# Patient Record
Sex: Male | Born: 1975 | Race: Black or African American | Hispanic: No | Marital: Single | Smoking: Current every day smoker
Health system: Southern US, Community
[De-identification: ages and names within clinical notes are randomized; demographics above are authoritative.]

## PROBLEM LIST (undated history)

## (undated) DIAGNOSIS — G43909 Migraine, unspecified, not intractable, without status migrainosus: Secondary | ICD-10-CM

## (undated) DIAGNOSIS — I2699 Other pulmonary embolism without acute cor pulmonale: Secondary | ICD-10-CM

## (undated) DIAGNOSIS — I10 Essential (primary) hypertension: Secondary | ICD-10-CM

## (undated) DIAGNOSIS — J189 Pneumonia, unspecified organism: Secondary | ICD-10-CM

---

## 1997-06-26 ENCOUNTER — Emergency Department (HOSPITAL_COMMUNITY): Admission: EM | Admit: 1997-06-26 | Discharge: 1997-06-26 | Payer: Self-pay | Admitting: Emergency Medicine

## 1998-05-30 ENCOUNTER — Emergency Department (HOSPITAL_COMMUNITY): Admission: EM | Admit: 1998-05-30 | Discharge: 1998-05-30 | Payer: Self-pay | Admitting: Emergency Medicine

## 1998-07-17 ENCOUNTER — Emergency Department (HOSPITAL_COMMUNITY): Admission: EM | Admit: 1998-07-17 | Discharge: 1998-07-17 | Payer: Self-pay | Admitting: Internal Medicine

## 1998-11-10 ENCOUNTER — Emergency Department (HOSPITAL_COMMUNITY): Admission: EM | Admit: 1998-11-10 | Discharge: 1998-11-10 | Payer: Self-pay | Admitting: Emergency Medicine

## 1998-11-11 ENCOUNTER — Encounter: Payer: Self-pay | Admitting: Emergency Medicine

## 1999-02-03 ENCOUNTER — Emergency Department (HOSPITAL_COMMUNITY): Admission: EM | Admit: 1999-02-03 | Discharge: 1999-02-03 | Payer: Self-pay | Admitting: Emergency Medicine

## 1999-08-28 ENCOUNTER — Emergency Department (HOSPITAL_COMMUNITY): Admission: EM | Admit: 1999-08-28 | Discharge: 1999-08-28 | Payer: Self-pay | Admitting: Emergency Medicine

## 2002-09-23 ENCOUNTER — Emergency Department (HOSPITAL_COMMUNITY): Admission: EM | Admit: 2002-09-23 | Discharge: 2002-09-23 | Payer: Self-pay | Admitting: Emergency Medicine

## 2003-01-06 ENCOUNTER — Emergency Department (HOSPITAL_COMMUNITY): Admission: EM | Admit: 2003-01-06 | Discharge: 2003-01-06 | Payer: Self-pay | Admitting: Emergency Medicine

## 2003-10-09 ENCOUNTER — Emergency Department (HOSPITAL_COMMUNITY): Admission: EM | Admit: 2003-10-09 | Discharge: 2003-10-09 | Payer: Self-pay | Admitting: Emergency Medicine

## 2003-10-09 ENCOUNTER — Emergency Department (HOSPITAL_COMMUNITY): Admission: EM | Admit: 2003-10-09 | Discharge: 2003-10-09 | Payer: Self-pay | Admitting: Family Medicine

## 2004-03-12 ENCOUNTER — Emergency Department (HOSPITAL_COMMUNITY): Admission: EM | Admit: 2004-03-12 | Discharge: 2004-03-12 | Payer: Self-pay | Admitting: Emergency Medicine

## 2004-03-24 ENCOUNTER — Emergency Department (HOSPITAL_COMMUNITY): Admission: EM | Admit: 2004-03-24 | Discharge: 2004-03-24 | Payer: Self-pay | Admitting: Emergency Medicine

## 2004-03-26 ENCOUNTER — Emergency Department (HOSPITAL_COMMUNITY): Admission: EM | Admit: 2004-03-26 | Discharge: 2004-03-26 | Payer: Self-pay | Admitting: Emergency Medicine

## 2004-12-29 ENCOUNTER — Emergency Department (HOSPITAL_COMMUNITY): Admission: EM | Admit: 2004-12-29 | Discharge: 2004-12-29 | Payer: Self-pay | Admitting: Emergency Medicine

## 2005-08-29 ENCOUNTER — Emergency Department (HOSPITAL_COMMUNITY): Admission: EM | Admit: 2005-08-29 | Discharge: 2005-08-30 | Payer: Self-pay | Admitting: *Deleted

## 2005-09-10 ENCOUNTER — Ambulatory Visit: Payer: Self-pay | Admitting: *Deleted

## 2005-09-10 ENCOUNTER — Ambulatory Visit: Payer: Self-pay | Admitting: Family Medicine

## 2005-09-24 ENCOUNTER — Ambulatory Visit: Payer: Self-pay | Admitting: Family Medicine

## 2005-10-01 ENCOUNTER — Ambulatory Visit: Payer: Self-pay | Admitting: Family Medicine

## 2005-10-07 ENCOUNTER — Ambulatory Visit: Payer: Self-pay | Admitting: Family Medicine

## 2005-10-21 ENCOUNTER — Emergency Department (HOSPITAL_COMMUNITY): Admission: EM | Admit: 2005-10-21 | Discharge: 2005-10-22 | Payer: Self-pay | Admitting: Emergency Medicine

## 2005-11-23 ENCOUNTER — Emergency Department (HOSPITAL_COMMUNITY): Admission: EM | Admit: 2005-11-23 | Discharge: 2005-11-23 | Payer: Self-pay | Admitting: Emergency Medicine

## 2006-03-13 ENCOUNTER — Emergency Department (HOSPITAL_COMMUNITY): Admission: EM | Admit: 2006-03-13 | Discharge: 2006-03-14 | Payer: Self-pay | Admitting: Emergency Medicine

## 2006-04-10 ENCOUNTER — Emergency Department (HOSPITAL_COMMUNITY): Admission: EM | Admit: 2006-04-10 | Discharge: 2006-04-11 | Payer: Self-pay | Admitting: Emergency Medicine

## 2006-08-09 ENCOUNTER — Emergency Department (HOSPITAL_COMMUNITY): Admission: EM | Admit: 2006-08-09 | Discharge: 2006-08-09 | Payer: Self-pay | Admitting: Emergency Medicine

## 2007-02-23 ENCOUNTER — Emergency Department (HOSPITAL_COMMUNITY): Admission: EM | Admit: 2007-02-23 | Discharge: 2007-02-23 | Payer: Self-pay | Admitting: Emergency Medicine

## 2007-05-18 ENCOUNTER — Emergency Department (HOSPITAL_COMMUNITY): Admission: EM | Admit: 2007-05-18 | Discharge: 2007-05-18 | Payer: Self-pay | Admitting: Emergency Medicine

## 2009-05-05 ENCOUNTER — Emergency Department (HOSPITAL_COMMUNITY): Admission: EM | Admit: 2009-05-05 | Discharge: 2009-05-05 | Payer: Self-pay | Admitting: Emergency Medicine

## 2009-09-28 ENCOUNTER — Emergency Department (HOSPITAL_COMMUNITY): Admission: EM | Admit: 2009-09-28 | Discharge: 2009-09-29 | Payer: Self-pay | Admitting: Occupational Therapy

## 2010-05-26 LAB — CBC
Hemoglobin: 14.4 g/dL (ref 13.0–17.0)
RBC: 4.65 MIL/uL (ref 4.22–5.81)

## 2010-05-26 LAB — URINALYSIS, ROUTINE W REFLEX MICROSCOPIC
Bilirubin Urine: NEGATIVE
Glucose, UA: NEGATIVE mg/dL
Hgb urine dipstick: NEGATIVE
Ketones, ur: 15 mg/dL — AB
Protein, ur: NEGATIVE mg/dL
Specific Gravity, Urine: 1.013 (ref 1.005–1.030)
Urobilinogen, UA: 1 mg/dL (ref 0.0–1.0)
pH: 7.5 (ref 5.0–8.0)

## 2010-05-26 LAB — DIFFERENTIAL
Eosinophils Absolute: 0 10*3/uL (ref 0.0–0.7)
Eosinophils Relative: 0 % (ref 0–5)
Lymphocytes Relative: 7 % — ABNORMAL LOW (ref 12–46)
Lymphs Abs: 1 10*3/uL (ref 0.7–4.0)
Monocytes Absolute: 0.6 10*3/uL (ref 0.1–1.0)
Neutro Abs: 14 10*3/uL — ABNORMAL HIGH (ref 1.7–7.7)
Neutrophils Relative %: 90 % — ABNORMAL HIGH (ref 43–77)

## 2010-05-26 LAB — LIPASE, BLOOD: Lipase: 19 U/L (ref 11–59)

## 2010-05-26 LAB — COMPREHENSIVE METABOLIC PANEL
AST: 37 U/L (ref 0–37)
Calcium: 9.3 mg/dL (ref 8.4–10.5)
Creatinine, Ser: 1.31 mg/dL (ref 0.4–1.5)
GFR calc Af Amer: >60 mL/min (ref >60)
Total Bilirubin: 0.9 mg/dL (ref 0.3–1.2)

## 2010-05-26 LAB — HEMOCCULT GUIAC POC 1CARD (OFFICE): Fecal Occult Bld: NEGATIVE

## 2010-05-26 LAB — RAPID URINE DRUG SCREEN, HOSP PERFORMED: Barbiturates: NOT DETECTED

## 2010-07-06 ENCOUNTER — Emergency Department (HOSPITAL_COMMUNITY)
Admission: EM | Admit: 2010-07-06 | Discharge: 2010-07-07 | Disposition: A | Discharge status: Other Acute Inpatient Hospital | Payer: Self-pay | Attending: Emergency Medicine | Admitting: Emergency Medicine

## 2010-07-06 DIAGNOSIS — I10 Essential (primary) hypertension: Secondary | ICD-10-CM | POA: Insufficient documentation

## 2010-07-06 DIAGNOSIS — I498 Other specified cardiac arrhythmias: Secondary | ICD-10-CM | POA: Insufficient documentation

## 2010-07-06 DIAGNOSIS — F3289 Other specified depressive episodes: Secondary | ICD-10-CM | POA: Insufficient documentation

## 2010-07-06 DIAGNOSIS — F329 Major depressive disorder, single episode, unspecified: Secondary | ICD-10-CM

## 2010-07-06 DIAGNOSIS — T50901A Poisoning by unspecified drugs, medicaments and biological substances, accidental (unintentional), initial encounter: Secondary | ICD-10-CM | POA: Insufficient documentation

## 2010-07-06 DIAGNOSIS — T50902A Poisoning by unspecified drugs, medicaments and biological substances, intentional self-harm, initial encounter: Secondary | ICD-10-CM | POA: Insufficient documentation

## 2010-07-06 LAB — URINALYSIS, ROUTINE W REFLEX MICROSCOPIC
Nitrite: NEGATIVE
Urobilinogen, UA: 0.2 mg/dL (ref 0.0–1.0)

## 2010-07-06 LAB — COMPREHENSIVE METABOLIC PANEL
BUN: 8 mg/dL (ref 6–23)
CO2: 29 mEq/L (ref 19–32)
Calcium: 9.3 mg/dL (ref 8.4–10.5)
Chloride: 103 mEq/L (ref 96–112)
Creatinine, Ser: 1.37 mg/dL (ref 0.4–1.5)
GFR calc Af Amer: >60 mL/min (ref >60)
GFR calc non Af Amer: 59 mL/min — ABNORMAL LOW (ref >60)
Glucose, Bld: 111 mg/dL — ABNORMAL HIGH (ref 70–99)
Total Bilirubin: 0.9 mg/dL (ref 0.3–1.2)

## 2010-07-06 LAB — CBC
HCT: 46.4 % (ref 39.0–52.0)
MCH: 30.6 pg (ref 26.0–34.0)
MCHC: 35.1 g/dL (ref 30.0–36.0)
MCV: 87.2 fL (ref 78.0–100.0)
Platelets: 172 10*3/uL (ref 150–400)
RBC: 5.32 MIL/uL (ref 4.22–5.81)
RDW: 13.1 % (ref 11.5–15.5)
WBC: 5.7 10*3/uL (ref 4.0–10.5)

## 2010-07-06 LAB — ETHANOL: Alcohol, Ethyl (B): <5 mg/dL (ref 0–10)

## 2010-07-06 LAB — DIFFERENTIAL
Basophils Absolute: 0 10*3/uL (ref 0.0–0.1)
Eosinophils Absolute: 0.1 10*3/uL (ref 0.0–0.7)
Eosinophils Relative: 1 % (ref 0–5)
Lymphocytes Relative: 29 % (ref 12–46)
Lymphs Abs: 1.7 10*3/uL (ref 0.7–4.0)
Monocytes Absolute: 0.5 10*3/uL (ref 0.1–1.0)
Neutro Abs: 3.5 10*3/uL (ref 1.7–7.7)

## 2010-07-06 LAB — RAPID URINE DRUG SCREEN, HOSP PERFORMED
Amphetamines: NOT DETECTED
Barbiturates: NOT DETECTED
Opiates: NOT DETECTED
Tetrahydrocannabinol: POSITIVE — AB

## 2010-07-06 LAB — LIPASE, BLOOD: Lipase: 24 U/L (ref 11–59)

## 2010-07-06 LAB — SALICYLATE LEVEL: Salicylate Lvl: <4 mg/dL (ref 2.8–20.0)

## 2010-07-06 LAB — TRICYCLICS SCREEN, URINE: TCA Scrn: NOT DETECTED

## 2010-07-07 ENCOUNTER — Inpatient Hospital Stay (HOSPITAL_COMMUNITY)
Admission: AD | Admit: 2010-07-07 | Discharge: 2010-07-10 | DRG: 882 | Disposition: A | Discharge status: Home / Self Care | Payer: PRIVATE HEALTH INSURANCE | Source: Ambulatory Visit | Attending: Psychiatry | Admitting: Psychiatry

## 2010-07-07 DIAGNOSIS — T471X1A Poisoning by other antacids and anti-gastric-secretion drugs, accidental (unintentional), initial encounter: Secondary | ICD-10-CM

## 2010-07-07 DIAGNOSIS — T424X4A Poisoning by benzodiazepines, undetermined, initial encounter: Secondary | ICD-10-CM

## 2010-07-07 DIAGNOSIS — F4325 Adjustment disorder with mixed disturbance of emotions and conduct: Principal | ICD-10-CM

## 2010-07-07 DIAGNOSIS — T43294A Poisoning by other antidepressants, undetermined, initial encounter: Secondary | ICD-10-CM

## 2010-07-07 DIAGNOSIS — T50992A Poisoning by other drugs, medicaments and biological substances, intentional self-harm, initial encounter: Secondary | ICD-10-CM

## 2010-07-07 DIAGNOSIS — J42 Unspecified chronic bronchitis: Secondary | ICD-10-CM

## 2010-07-07 DIAGNOSIS — T43501A Poisoning by unspecified antipsychotics and neuroleptics, accidental (unintentional), initial encounter: Secondary | ICD-10-CM

## 2010-07-07 DIAGNOSIS — T43502A Poisoning by unspecified antipsychotics and neuroleptics, intentional self-harm, initial encounter: Secondary | ICD-10-CM

## 2010-07-07 DIAGNOSIS — T380X1A Poisoning by glucocorticoids and synthetic analogues, accidental (unintentional), initial encounter: Secondary | ICD-10-CM

## 2010-07-07 DIAGNOSIS — Z86711 Personal history of pulmonary embolism: Secondary | ICD-10-CM

## 2010-07-07 DIAGNOSIS — T502X1A Poisoning by carbonic-anhydrase inhibitors, benzothiadiazides and other diuretics, accidental (unintentional), initial encounter: Secondary | ICD-10-CM

## 2010-07-10 DIAGNOSIS — F339 Major depressive disorder, recurrent, unspecified: Secondary | ICD-10-CM

## 2010-07-10 NOTE — Discharge Summary (Signed)
  NAME:  Alec Grant, Alec Grant          ACCOUNT NO.:  [id number]  MEDICAL RECORD NO.:  [id number]           PATIENT TYPE:  I  LOCATION:  0506                          FACILITY:  [facility]  PHYSICIAN:  Franchot Gallo, MD     DATE OF BIRTH:  1976/03/02  DATE OF ADMISSION:  07/07/2010 DATE OF DISCHARGE:  07/10/2010                              DISCHARGE SUMMARY   REASON FOR ADMISSION:  This is a 35 year old male brought in to the hospital after overdosing on his prednisone, Xanax, Lasix, Seroquel, Prilosec and Zoloft with unknown amounts.  He states he had gotten frustrated and tried to take his life.  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 Axis II: Trouble with personality issues. Axis III:  History of chronic bronchitis, history of pulmonary embolus. Axis IV: Relationship issues. Axis V: Is 50-55.  SIGNIFICANT LABS:  The patient had elevated blood pressures in the emergency room up to 207/127.  Urine drug screen positive for marijuana. Glucose elevated 111.  SIGNIFICANT FINDINGS:  Patient is alert, oriented, casually groomed, adequately nourished. his speech remains a bit soft, otherwise, normal rate, rhythm and tone.  Mood was appropriate.  Thought processes are clear.  Rational and goal oriented.  The patient was admitted on the adult milieu on the mood disorder group.  We started him on Zoloft 50 mg daily.  The patient to have a follow-up appointment at [facility] and possible therapy.  He was participating in groups.  We contacted the patient's grandmother to gather collateral information and provide information and address any safety issues.  Grandmother states that the patient was going to help out with care of his 42-year-old son and she agreed to secure the patient's home and lock up any medication before the patient was discharged.  The patient was doing well.  He was fully alert and cooperative.  He talked openly about his stressors.   He realized he needed to get follow up with his medical issues.  He was tolerating his medications.  Denied any suicidal or homicidal thoughts. The patient was stable for discharge.  DISCHARGE MEDICATIONS: 1. Zoloft 50 mg 1 daily. 2. Trazodone 50 mg 1 q.h.s. p.r.n. for sleep.  DISCHARGE FOLLOWUP:  His follow-up appointment was set at [facility] on Friday, May 4 at 2:00 p.m.Marland Kitchen  Phone number [phone].     Landry Corporal, N.P.   _________________________________ Franchot Gallo, MD    JO/MEDQ  D:  07/10/2010  T:  07/10/2010  Job:  [id number]  Electronically Signed by Limmie PatriciaP. on 07/10/2010 02:05:34 PM Electronically Signed by Franchot Gallo MD on 07/10/2010 04:10:18 PM

## 2010-07-16 NOTE — H&P (Signed)
NAME:  Alec Grant, Alec Grant          ACCOUNT NO.:  [id number]  MEDICAL RECORD NO.:  [id number]           PATIENT TYPE:  I  LOCATION:  0506                          FACILITY:  [facility]  PHYSICIAN:  Franchot Gallo, MD     DATE OF BIRTH:  [birth year]  DATE OF ADMISSION:  07/07/2010 DATE OF DISCHARGE:                      PSYCHIATRIC ADMISSION ASSESSMENT   HISTORY OF PRESENT ILLNESS:  This is a voluntary admission to the services of Dr. Harvie Heck Alec Grant. This is a 35 year old single [place] American male. He was brought to [facility] via EMS.  He lives with his mother. Apparently, he overdosed on her medications prednisone, Xanax, Lasix, Seroquel, Xanax, Prilosec and Zoloft an unknown amount was taken.  He was alert and oriented at that time.  He had taken meds approximately an hour before he was picked up.  He stated that he "had gotten frustrated and tried to take his life".  He reported that everygirl has always left me.  He was tearful.  He spoke very softly and low. He reported one other suicide attempt via overdose in 2003.  He was hospitalized at [facility].  He denies any history for homicidal ideation or psychosis.  The reason he had overdosed was he was supposed to have been in court this morning for a child custody hearing.  PAST PSYCHIATRIC HISTORY:  As stated he had one prior issue again involved relationship and that was back in 2003.  He was admitted to [facility].  He was prescribed antidepressants but he never took them.  SOCIAL HISTORY:  He went to the tenth grade.  He has never married.  He has two sons ages 47 and 27.  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  FAMILY HISTORY:  His mom is treated for depression.  She has been to [facility].  ALCOHOL AND DRUG HISTORY:  He does acknowledge using marijuana since he was a teen every now and  then.  PRIMARY CARE PROVIDER:  He has been seen by [facility] but he fell out of being followed by them.  MEDICAL PROBLEMS:  Apparently in 2007 he had a pulmonary embolus.  He was seen at [facility] and he will had follow-up at [facility]. He has not taken anticoagulants in a while.  He says he has chronic bronchitis, although he is not bothered by that.  MEDICATIONS:  None are prescribed.  DRUG ALLERGIES:  NO KNOWN DRUG ALLERGIES.  POSITIVE PHYSICAL FINDINGS:  He was afebrile.  He came in with a temperature of 97.4 with a high of 98.5.  His pulse ranged from 59-78, respirations were 14-20 and blood pressure ranged from 147/98 up to 207/127.  His urinalysis was negative.  His UDS was positive for marijuana.  He had no abnormalities of CBC, had no measurable alcohol. His metabolic profile again:  His glucose was slightly elevated at 111 but he had just taken prednisone.  MENTAL STATUS EXAM:  Today he is alert and oriented.  He is casually groomed and dressed  in his own clothing.  He appears to be adequately nourished.  His speech remains a little bit soft otherwise normal rate, rhythm and tone.  His mood is appropriate to the situation.  Thought processes are clear, rational and goal oriented.  He is willing to start an antidepressant.  Judgment and insight are fair.  Concentration and memory are intact.  Intelligence is average.  He denies being actively suicidal or homicidal.  He denies auditory or visual hallucinations.  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 AXIS II:  Rule out personality issues. AXIS III:  He reports chronic bronchitis.  He reports a history for a PE back in 2007 treated at [facility] and initially he was hypertensive due to his overdose. AXIS IV:  He has relationship issues. AXIS V:  35.  PLAN:  The plan is to start him on some Zoloft 50 mg p.o. daily.  Will have the case manager get him into follow-up  at [facility] and he will look into therapy perhaps a [facility] as they have a sliding scale over there so he can learn better relationship scales. Estimated length of stay is 3 days.     Mickie Leonarda Salon, P.A.-C.   ______________________________ Franchot Gallo, MD    MD/MEDQ  D:  07/08/2010  T:  07/08/2010  Job:  [id number]  Electronically Signed by Jaci Lazier ADAMS P.A.-C. on 07/14/2010 12:48:47 PM Electronically Signed by Franchot Gallo MD on 07/16/2010 04:43:33 PM

## 2010-10-28 ENCOUNTER — Emergency Department (HOSPITAL_COMMUNITY)
Admission: EM | Admit: 2010-10-28 | Discharge: 2010-10-29 | Disposition: A | Discharge status: Home / Self Care | Payer: Self-pay | Attending: Emergency Medicine | Admitting: Emergency Medicine

## 2010-10-28 DIAGNOSIS — H9209 Otalgia, unspecified ear: Secondary | ICD-10-CM | POA: Insufficient documentation

## 2010-10-28 DIAGNOSIS — R6889 Other general symptoms and signs: Secondary | ICD-10-CM | POA: Insufficient documentation

## 2010-10-28 DIAGNOSIS — IMO0001 Reserved for inherently not codable concepts without codable children: Secondary | ICD-10-CM | POA: Insufficient documentation

## 2010-10-28 DIAGNOSIS — R059 Cough, unspecified: Secondary | ICD-10-CM | POA: Insufficient documentation

## 2010-10-28 DIAGNOSIS — R5383 Other fatigue: Secondary | ICD-10-CM | POA: Insufficient documentation

## 2010-10-28 DIAGNOSIS — R6883 Chills (without fever): Secondary | ICD-10-CM | POA: Insufficient documentation

## 2010-10-28 DIAGNOSIS — J40 Bronchitis, not specified as acute or chronic: Secondary | ICD-10-CM | POA: Insufficient documentation

## 2010-10-28 DIAGNOSIS — J3489 Other specified disorders of nose and nasal sinuses: Secondary | ICD-10-CM | POA: Insufficient documentation

## 2010-10-28 DIAGNOSIS — J069 Acute upper respiratory infection, unspecified: Secondary | ICD-10-CM | POA: Insufficient documentation

## 2010-10-28 DIAGNOSIS — R05 Cough: Secondary | ICD-10-CM | POA: Insufficient documentation

## 2010-10-28 DIAGNOSIS — F3289 Other specified depressive episodes: Secondary | ICD-10-CM | POA: Insufficient documentation

## 2010-10-28 DIAGNOSIS — M62838 Other muscle spasm: Secondary | ICD-10-CM | POA: Insufficient documentation

## 2010-10-28 DIAGNOSIS — R5381 Other malaise: Secondary | ICD-10-CM | POA: Insufficient documentation

## 2010-10-28 DIAGNOSIS — F329 Major depressive disorder, single episode, unspecified: Secondary | ICD-10-CM | POA: Insufficient documentation

## 2010-10-28 DIAGNOSIS — J343 Hypertrophy of nasal turbinates: Secondary | ICD-10-CM | POA: Insufficient documentation

## 2010-10-28 DIAGNOSIS — R070 Pain in throat: Secondary | ICD-10-CM | POA: Insufficient documentation

## 2010-10-29 ENCOUNTER — Emergency Department (HOSPITAL_COMMUNITY): Payer: Self-pay

## 2010-11-10 ENCOUNTER — Emergency Department (HOSPITAL_COMMUNITY)
Admission: EM | Admit: 2010-11-10 | Discharge: 2010-11-11 | Disposition: A | Discharge status: Home / Self Care | Payer: Self-pay | Attending: Emergency Medicine | Admitting: Emergency Medicine

## 2010-11-10 ENCOUNTER — Emergency Department (HOSPITAL_COMMUNITY): Payer: Self-pay

## 2010-11-10 DIAGNOSIS — F3289 Other specified depressive episodes: Secondary | ICD-10-CM | POA: Insufficient documentation

## 2010-11-10 DIAGNOSIS — M25473 Effusion, unspecified ankle: Secondary | ICD-10-CM | POA: Insufficient documentation

## 2010-11-10 DIAGNOSIS — J42 Unspecified chronic bronchitis: Secondary | ICD-10-CM | POA: Insufficient documentation

## 2010-11-10 DIAGNOSIS — X500XXA Overexertion from strenuous movement or load, initial encounter: Secondary | ICD-10-CM | POA: Insufficient documentation

## 2010-11-10 DIAGNOSIS — F329 Major depressive disorder, single episode, unspecified: Secondary | ICD-10-CM | POA: Insufficient documentation

## 2010-11-10 DIAGNOSIS — M25579 Pain in unspecified ankle and joints of unspecified foot: Secondary | ICD-10-CM | POA: Insufficient documentation

## 2010-11-10 DIAGNOSIS — M25476 Effusion, unspecified foot: Secondary | ICD-10-CM | POA: Insufficient documentation

## 2010-11-10 DIAGNOSIS — S93409A Sprain of unspecified ligament of unspecified ankle, initial encounter: Secondary | ICD-10-CM | POA: Insufficient documentation

## 2010-12-03 LAB — I-STAT 8, (EC8 V) (CONVERTED LAB)
BUN: 8
Bicarbonate: 21.9
Glucose, Bld: 104 — ABNORMAL HIGH
Hemoglobin: 17.3 — ABNORMAL HIGH
Sodium: 134 — ABNORMAL LOW
TCO2: 23
pH, Ven: 7.549 — ABNORMAL HIGH

## 2010-12-03 LAB — POCT I-STAT CREATININE: Creatinine, Ser: 1.5

## 2010-12-03 LAB — INFLUENZA A+B VIRUS AG-DIRECT(RAPID)
Inflenza A Ag: NEGATIVE
Influenza B Ag: NEGATIVE

## 2010-12-14 LAB — CBC
HCT: 42
Hemoglobin: 14.6
MCHC: 34.9
MCV: 88.4
Platelets: 156
RBC: 4.75
RDW: 13.5
WBC: 11.2 — ABNORMAL HIGH

## 2010-12-14 LAB — POCT I-STAT CREATININE
Creatinine, Ser: 1.4
Operator id: 161631

## 2010-12-14 LAB — I-STAT 8, (EC8 V) (CONVERTED LAB)
Acid-Base Excess: 2
BUN: 13
Bicarbonate: 27.2 — ABNORMAL HIGH
Chloride: 105
Glucose, Bld: 106 — ABNORMAL HIGH
HCT: 47
Hemoglobin: 16
Operator id: 161631
Potassium: 3.8
Sodium: 138
TCO2: 28
pCO2, Ven: 42.4 — ABNORMAL LOW
pH, Ven: 7.414 — ABNORMAL HIGH

## 2010-12-14 LAB — DIFFERENTIAL
Basophils Absolute: 0
Basophils Relative: 0
Eosinophils Absolute: 0 — ABNORMAL LOW
Eosinophils Relative: 0
Lymphocytes Relative: 8 — ABNORMAL LOW
Lymphs Abs: 0.8
Monocytes Absolute: 0.3
Monocytes Relative: 3
Neutro Abs: 10 — ABNORMAL HIGH
Neutrophils Relative %: 90 — ABNORMAL HIGH

## 2011-03-04 ENCOUNTER — Encounter: Payer: Self-pay | Admitting: Family Medicine

## 2011-03-04 ENCOUNTER — Emergency Department (HOSPITAL_COMMUNITY)
Admission: EM | Admit: 2011-03-04 | Discharge: 2011-03-04 | Disposition: A | Discharge status: Home / Self Care | Payer: Self-pay | Attending: Emergency Medicine | Admitting: Emergency Medicine

## 2011-03-04 ENCOUNTER — Emergency Department (HOSPITAL_COMMUNITY): Payer: Self-pay

## 2011-03-04 DIAGNOSIS — F17200 Nicotine dependence, unspecified, uncomplicated: Secondary | ICD-10-CM | POA: Insufficient documentation

## 2011-03-04 DIAGNOSIS — J3489 Other specified disorders of nose and nasal sinuses: Secondary | ICD-10-CM | POA: Insufficient documentation

## 2011-03-04 DIAGNOSIS — R059 Cough, unspecified: Secondary | ICD-10-CM | POA: Insufficient documentation

## 2011-03-04 DIAGNOSIS — IMO0001 Reserved for inherently not codable concepts without codable children: Secondary | ICD-10-CM | POA: Insufficient documentation

## 2011-03-04 DIAGNOSIS — J988 Other specified respiratory disorders: Secondary | ICD-10-CM

## 2011-03-04 DIAGNOSIS — B9789 Other viral agents as the cause of diseases classified elsewhere: Secondary | ICD-10-CM | POA: Insufficient documentation

## 2011-03-04 DIAGNOSIS — R05 Cough: Secondary | ICD-10-CM | POA: Insufficient documentation

## 2011-03-04 MED ORDER — ACETAMINOPHEN 500 MG PO TABS: 1000.0000 mg | ORAL_TABLET | Freq: Once | ORAL | Status: DC

## 2011-03-04 MED ORDER — IBUPROFEN 200 MG PO TABS
600.0000 mg | ORAL_TABLET | Freq: Once | ORAL | Status: AC
  Administered 2011-03-04: 600 mg via ORAL
  Filled 2011-03-04: qty 3

## 2011-03-04 MED ORDER — HYDROCOD POLST-CHLORPHEN POLST 10-8 MG/5ML PO LQCR: 5.0000 mL | Freq: Every evening | ORAL | Status: DC | PRN

## 2011-03-04 MED ORDER — ACETAMINOPHEN 325 MG PO TABS
ORAL_TABLET | ORAL | Status: AC
  Administered 2011-03-04: 11:00:00
  Filled 2011-03-04: qty 3

## 2011-03-04 NOTE — ED Notes (Signed)
Pt sts body aches, cough, and fever for a few days. Pt moaning

## 2011-03-04 NOTE — ED Provider Notes (Signed)
History     CSN: [id number]  Arrival date & time 03/04/11  7829   First MD Initiated Contact with Patient 03/04/11 1010      Chief Complaint  Patient presents with  . Influenza    (Consider location/radiation/quality/duration/timing/severity/associated sxs/prior treatment) HPI Comments: Patient reports body aches, nonproductive cough, nasal congestion x 2 days.  Denies sore throat, SOB, abdominal pain, rash.  Is taking alka selzer cold medicine without relief.    Patient is a 35 y.o. male presenting with flu symptoms. The history is provided by the patient. The history is limited by the condition of the patient (patient uncomfortable, answers questions only in short phrases).  Influenza    History reviewed. No pertinent past medical history.  History reviewed. No pertinent past surgical history.  History reviewed. No pertinent family history.  History  Substance Use Topics  . Smoking status: Current Everyday Smoker  . Smokeless tobacco: Not on file  . Alcohol Use: No      Review of Systems  All other systems reviewed and are negative.    Allergies  Review of patient's allergies indicates no known allergies.  Home Medications   Current Outpatient Rx  Name Route Sig Dispense Refill  . ALKA-SELTZER PLUS COLD & FLU PO Oral Take 1 tablet by mouth daily as needed. For cold/flu symptoms       BP 193/117  Pulse 75  Temp(Src) 98.7 F (37.1 C) (Oral)  Resp 20  SpO2 100%  Physical Exam  Nursing note and vitals reviewed. Constitutional: He is oriented to person, place, and time. He appears well-developed and well-nourished.  HENT:  Head: Normocephalic and atraumatic.  Nose: Rhinorrhea present.  Mouth/Throat: Uvula is midline. Mucous membranes are not dry. Posterior oropharyngeal erythema present. No oropharyngeal exudate, posterior oropharyngeal edema or tonsillar abscesses.  Neck: Neck supple. No tracheal deviation present.  Cardiovascular: Normal rate,  regular rhythm and normal heart sounds.   Pulmonary/Chest: Breath sounds normal. No stridor. No respiratory distress. He has no wheezes. He has no rales. He exhibits no tenderness.       +cough  Abdominal: Soft. Bowel sounds are normal. He exhibits no distension. There is no tenderness. There is no rebound and no guarding.  Neurological: He is alert and oriented to person, place, and time.    ED Course  Procedures (including critical care time)  Labs Reviewed - No data to display Dg Chest 2 View  03/04/2011  *RADIOLOGY REPORT*  Clinical Data: Cough, myalgia is  CHEST - 2 VIEW  Comparison: 10/29/2010  Findings: Cardiomediastinal silhouette is within normal limits. The lungs are clear. No pleural effusion.  No pneumothorax.  No acute osseous abnormality.  IMPRESSION: Normal chest.  Original Report Authenticated By: Harrel Lemon, M.D.     1. Viral respiratory illness       MDM  Otherwise healthy patient with two days of myalgias, nonproductive cough.  CXR is normal.  Likely viral respiratory illness given high rates of influenza in the community.          Rise Patience, [place] 03/04/11 1440

## 2011-03-04 NOTE — ED Notes (Signed)
Patient transported to X-ray 

## 2011-03-04 NOTE — ED Provider Notes (Signed)
Medical screening examination/treatment/procedure(s) were performed by non-physician practitioner and as supervising physician I was immediately available for consultation/collaboration.    Celene Kras, MD 03/04/11 [phone]

## 2011-03-07 ENCOUNTER — Inpatient Hospital Stay (HOSPITAL_COMMUNITY)
Admission: EM | Admit: 2011-03-07 | Discharge: 2011-03-10 | DRG: 194 | Disposition: A | Discharge status: Home / Self Care | Payer: Self-pay | Attending: Internal Medicine | Admitting: Internal Medicine

## 2011-03-07 ENCOUNTER — Encounter (HOSPITAL_COMMUNITY): Payer: Self-pay | Admitting: *Deleted

## 2011-03-07 ENCOUNTER — Emergency Department (HOSPITAL_COMMUNITY): Payer: Self-pay

## 2011-03-07 DIAGNOSIS — J189 Pneumonia, unspecified organism: Principal | ICD-10-CM | POA: Diagnosis present

## 2011-03-07 DIAGNOSIS — J42 Unspecified chronic bronchitis: Secondary | ICD-10-CM | POA: Diagnosis present

## 2011-03-07 DIAGNOSIS — F17200 Nicotine dependence, unspecified, uncomplicated: Secondary | ICD-10-CM | POA: Diagnosis present

## 2011-03-07 DIAGNOSIS — I10 Essential (primary) hypertension: Secondary | ICD-10-CM | POA: Diagnosis present

## 2011-03-07 DIAGNOSIS — E876 Hypokalemia: Secondary | ICD-10-CM | POA: Diagnosis present

## 2011-03-07 DIAGNOSIS — IMO0002 Reserved for concepts with insufficient information to code with codable children: Secondary | ICD-10-CM

## 2011-03-07 LAB — CBC
HCT: 43.7 % (ref 39.0–52.0)
MCH: 30.5 pg (ref 26.0–34.0)
MCHC: 35.5 g/dL (ref 30.0–36.0)
MCV: 86 fL (ref 78.0–100.0)
RDW: 12.6 % (ref 11.5–15.5)

## 2011-03-07 LAB — DIFFERENTIAL
Basophils Absolute: 0 10*3/uL (ref 0.0–0.1)
Basophils Relative: 0 % (ref 0–1)
Eosinophils Absolute: 0 10*3/uL (ref 0.0–0.7)
Eosinophils Relative: 0 % (ref 0–5)
Lymphocytes Relative: 15 % (ref 12–46)
Monocytes Absolute: 0.6 10*3/uL (ref 0.1–1.0)

## 2011-03-07 LAB — POCT I-STAT, CHEM 8
Calcium, Ion: 0.99 mmol/L — ABNORMAL LOW (ref 1.12–1.32)
HCT: 48 % (ref 39.0–52.0)
Hemoglobin: 16.3 g/dL (ref 13.0–17.0)
Sodium: 136 mEq/L (ref 135–145)
TCO2: 25 mmol/L (ref 0–100)

## 2011-03-07 MED ORDER — DEXTROSE 5 % IV SOLN
1.0000 g | Freq: Once | INTRAVENOUS | Status: AC
  Administered 2011-03-07: via INTRAVENOUS
  Filled 2011-03-07: qty 10

## 2011-03-07 MED ORDER — PREDNISONE 20 MG PO TABS
60.0000 mg | ORAL_TABLET | Freq: Once | ORAL | Status: AC
  Administered 2011-03-08: 60 mg via ORAL
  Filled 2011-03-07: qty 3

## 2011-03-07 MED ORDER — ALBUTEROL SULFATE (5 MG/ML) 0.5% IN NEBU
5.0000 mg | INHALATION_SOLUTION | Freq: Once | RESPIRATORY_TRACT | Status: AC
  Administered 2011-03-08: 5 mg via RESPIRATORY_TRACT
  Filled 2011-03-07: qty 1

## 2011-03-07 MED ORDER — ACETAMINOPHEN 325 MG PO TABS
650.0000 mg | ORAL_TABLET | Freq: Once | ORAL | Status: AC
  Administered 2011-03-07: 650 mg via ORAL
  Filled 2011-03-07: qty 2

## 2011-03-07 MED ORDER — SODIUM CHLORIDE 0.9 % IV BOLUS (SEPSIS)
1000.0000 mL | Freq: Once | INTRAVENOUS | Status: AC
  Administered 2011-03-07: 1000 mL via INTRAVENOUS

## 2011-03-07 MED ORDER — DEXTROSE 5 % IV SOLN
500.0000 mg | Freq: Once | INTRAVENOUS | Status: AC
  Administered 2011-03-08: 500 mg via INTRAVENOUS
  Filled 2011-03-07: qty 500

## 2011-03-07 MED ORDER — POTASSIUM CHLORIDE CRYS ER 20 MEQ PO TBCR
40.0000 meq | EXTENDED_RELEASE_TABLET | Freq: Once | ORAL | Status: AC
  Administered 2011-03-07: 40 meq via ORAL
  Filled 2011-03-07: qty 2

## 2011-03-07 MED ORDER — KETOROLAC TROMETHAMINE 60 MG/2ML IM SOLN
60.0000 mg | Freq: Once | INTRAMUSCULAR | Status: AC
  Administered 2011-03-07: 60 mg via INTRAMUSCULAR
  Filled 2011-03-07: qty 2

## 2011-03-07 NOTE — ED Provider Notes (Signed)
History     CSN: [id number]  Arrival date & time 03/07/11  4098   First MD Initiated Contact with Patient 03/07/11 2113      Chief Complaint  Patient presents with  . Influenza    (Consider location/radiation/quality/duration/timing/severity/associated sxs/prior treatment) Patient is a 35 y.o. male presenting with flu symptoms. The history is provided by the patient.  Influenza This is a new problem. The current episode started in the past 7 days. The problem occurs constantly. The problem has been unchanged. Associated symptoms include chills, congestion, coughing, diaphoresis, fatigue, a fever, headaches, myalgias and a sore throat. Pertinent negatives include no abdominal pain, chest pain, nausea, neck pain, rash or weakness. The symptoms are aggravated by nothing. He has tried acetaminophen for the symptoms. The treatment provided no relief.  PT was seen for the same 4 days ago. Has not taken medications he was prescribed.   History reviewed. No pertinent past medical history.  History reviewed. No pertinent past surgical history.  History reviewed. No pertinent family history.  History  Substance Use Topics  . Smoking status: Current Everyday Smoker  . Smokeless tobacco: Not on file  . Alcohol Use: No      Review of Systems  Constitutional: Positive for fever, chills, diaphoresis and fatigue.  HENT: Positive for ear pain, congestion and sore throat. Negative for trouble swallowing, neck pain and neck stiffness.   Eyes: Negative.   Respiratory: Positive for cough.   Cardiovascular: Negative for chest pain.  Gastrointestinal: Negative for nausea and abdominal pain.  Genitourinary: Negative.   Musculoskeletal: Positive for myalgias.  Skin: Negative for rash.  Neurological: Positive for headaches. Negative for weakness.  Psychiatric/Behavioral: Negative.     Allergies  Review of patient's allergies indicates no known allergies.  Home Medications   Current  Outpatient Rx  Name Route Sig Dispense Refill  . HYDROCOD POLST-CHLORPHEN POLST 10-8 MG/5ML PO LQCR Oral Take 5 mLs by mouth at bedtime as needed (cough). 80 mL 0  . ALKA-SELTZER PLUS COLD & FLU PO Oral Take 1 tablet by mouth daily as needed. For cold/flu symptoms       BP 161/88  Pulse 95  Temp(Src) 100.4 F (38 C) (Oral)  Resp 24  SpO2 99%  Physical Exam  Nursing note and vitals reviewed. Constitutional: He is oriented to person, place, and time. He appears well-developed and well-nourished.       Uncomfortable appearing, crying  Eyes: Conjunctivae are normal.  Neck: Normal range of motion. Neck supple.       No meningismus  Cardiovascular: Normal rate, regular rhythm and normal heart sounds.   Pulmonary/Chest: Effort normal and breath sounds normal. No respiratory distress. He has no wheezes.  Abdominal: Soft. Bowel sounds are normal. He exhibits no distension. There is no tenderness.  Musculoskeletal: Normal range of motion. He exhibits no edema.  Lymphadenopathy:    He has no cervical adenopathy.  Neurological: He is alert and oriented to person, place, and time.  Skin: Skin is warm and dry. No erythema.    ED Course  Procedures (including critical care time)  Pt with flu like symptoms. CXR 3 days ago negative. Pt feels like his cough and SOB worsening. Will get CXR repeated to r/o pneumonia. Will give meds for fever. Will monitor.  Results for orders placed during the hospital encounter of 03/07/11  CBC      Component Value Range   WBC 9.2  4.0 - 10.5 (K/uL)   RBC 5.08  4.22 -  5.81 (MIL/uL)   Hemoglobin 15.5  13.0 - 17.0 (g/dL)   HCT 16.1  09.6 - 04.5 (%)   MCV 86.0  78.0 - 100.0 (fL)   MCH 30.5  26.0 - 34.0 (pg)   MCHC 35.5  30.0 - 36.0 (g/dL)   RDW 40.9  81.1 - 91.4 (%)   Platelets 121 (*) 150 - 400 (K/uL)  DIFFERENTIAL      Component Value Range   Neutrophils Relative 78 (*) 43 - 77 (%)   Neutro Abs 7.2  1.7 - 7.7 (K/uL)   Lymphocytes Relative 15  12 - 46  (%)   Lymphs Abs 1.3  0.7 - 4.0 (K/uL)   Monocytes Relative 7  3 - 12 (%)   Monocytes Absolute 0.6  0.1 - 1.0 (K/uL)   Eosinophils Relative 0  0 - 5 (%)   Eosinophils Absolute 0.0  0.0 - 0.7 (K/uL)   Basophils Relative 0  0 - 1 (%)   Basophils Absolute 0.0  0.0 - 0.1 (K/uL)  POCT I-STAT, CHEM 8      Component Value Range   Sodium 136  135 - 145 (mEq/L)   Potassium 2.8 (*) 3.5 - 5.1 (mEq/L)   Chloride 102  96 - 112 (mEq/L)   BUN 12  6 - 23 (mg/dL)   Creatinine, Ser 7.82 (*) 0.50 - 1.35 (mg/dL)   Glucose, Bld 956 (*) 70 - 99 (mg/dL)   Calcium, Ion 2.13 (*) 1.12 - 1.32 (mmol/L)   TCO2 25  0 - 100 (mmol/L)   Hemoglobin 16.3  13.0 - 17.0 (g/dL)   HCT 08.6  57.8 - 46.9 (%)  URINALYSIS, MICROSCOPIC ONLY      Component Value Range   Color, Urine YELLOW  YELLOW    APPearance CLEAR  CLEAR    Specific Gravity, Urine 1.029  1.005 - 1.030    pH 5.5  5.0 - 8.0    Glucose, UA NEGATIVE  NEGATIVE (mg/dL)   Hgb urine dipstick NEGATIVE  NEGATIVE    Bilirubin Urine NEGATIVE  NEGATIVE    Ketones, ur 15 (*) NEGATIVE (mg/dL)   Protein, ur 629 (*) NEGATIVE (mg/dL)   Urobilinogen, UA 1.0  0.0 - 1.0 (mg/dL)   Nitrite NEGATIVE  NEGATIVE    Leukocytes, UA NEGATIVE  NEGATIVE    WBC, UA 0-2  <3 (WBC/hpf)   RBC / HPF 0-2  <3 (RBC/hpf)   Bacteria, UA RARE  RARE    Squamous Epithelial / LPF RARE  RARE    Dg Chest 2 View  03/07/2011  *RADIOLOGY REPORT*  Clinical Data: Fever.  Bodyaches and cough  CHEST - 2 VIEW  Comparison: 03/04/2011  Findings: Heart size is normal.  There is no pleural effusion or pulmonary edema.  Airspace opacification within the right middle lobe and left lower lobe identified.  New from previous exam.  Upper lobes appear clear.  Visualized osseous structures are unremarkable.  IMPRESSION:  1.  Bibasilar airspace opacities consistent with multifocal pneumonia.  Original Report Authenticated By: Rosealee Albee, M.D.   Dg Chest 2 View  03/04/2011  *RADIOLOGY REPORT*  Clinical Data:  Cough, myalgia is  CHEST - 2 VIEW  Comparison: 10/29/2010  Findings: Cardiomediastinal silhouette is within normal limits. The lungs are clear. No pleural effusion.  No pneumothorax.  No acute osseous abnormality.  IMPRESSION: Normal chest.  Original Report Authenticated By: Harrel Lemon, M.D.    Rocephin and zithromax ordered for pneumonia. Pt continues to feel weak. Pulse ox  drops when stands up and tries to walk. Pt actually unable to even walk to the door, states feels like going to "pass out." Will admit.   Spoke with [facility], will admit to Team 6, tele bed MDM          Lottie Mussel, PA 03/08/11 0126

## 2011-03-07 NOTE — ED Notes (Signed)
The  Pt has been ill for one week .  Aching all over  Headache temp chills cough.  He was seen here on Sunday and diagnosed with the flu.  He did not  Get his rxs filled and he has not taken tylenol or advil today.

## 2011-03-07 NOTE — ED Notes (Signed)
Pt reports being diagnosed with the flu on Sunday, not getting his medication filled and not feeling any better today.  Pt reports chills, body aches and fatigue.  Reports taking tylenol earlier this morning.  Skin warm, dry and intact.  Neuro intact.

## 2011-03-08 ENCOUNTER — Encounter (HOSPITAL_COMMUNITY): Payer: Self-pay

## 2011-03-08 DIAGNOSIS — J189 Pneumonia, unspecified organism: Secondary | ICD-10-CM | POA: Diagnosis present

## 2011-03-08 LAB — URINALYSIS, MICROSCOPIC ONLY
Bilirubin Urine: NEGATIVE
Glucose, UA: NEGATIVE mg/dL
Hgb urine dipstick: NEGATIVE
Ketones, ur: 15 mg/dL — AB
Leukocytes, UA: NEGATIVE
Nitrite: NEGATIVE
Protein, ur: 100 mg/dL — AB
Specific Gravity, Urine: 1.029 (ref 1.005–1.030)
Urobilinogen, UA: 1 mg/dL (ref 0.0–1.0)
pH: 5.5 (ref 5.0–8.0)

## 2011-03-08 LAB — HIV ANTIBODY (ROUTINE TESTING W REFLEX): HIV: NONREACTIVE

## 2011-03-08 LAB — CREATININE, SERUM
Creatinine, Ser: 1.24 mg/dL (ref 0.50–1.35)
GFR calc non Af Amer: 73 mL/min — ABNORMAL LOW (ref >90)

## 2011-03-08 LAB — COMPREHENSIVE METABOLIC PANEL
Alkaline Phosphatase: 54 U/L (ref 39–117)
BUN: 14 mg/dL (ref 6–23)
CO2: 24 mEq/L (ref 19–32)
Chloride: 102 mEq/L (ref 96–112)
GFR calc Af Amer: >90 mL/min (ref >90)
GFR calc non Af Amer: 84 mL/min — ABNORMAL LOW (ref >90)
Glucose, Bld: 129 mg/dL — ABNORMAL HIGH (ref 70–99)
Potassium: 3.6 mEq/L (ref 3.5–5.1)
Total Bilirubin: 0.2 mg/dL — ABNORMAL LOW (ref 0.3–1.2)
Total Protein: 7.1 g/dL (ref 6.0–8.3)

## 2011-03-08 LAB — CBC
Hemoglobin: 14.8 g/dL (ref 13.0–17.0)
Platelets: 122 10*3/uL — ABNORMAL LOW (ref 150–400)
RBC: 4.85 MIL/uL (ref 4.22–5.81)
RDW: 12.8 % (ref 11.5–15.5)
WBC: 14.1 10*3/uL — ABNORMAL HIGH (ref 4.0–10.5)
WBC: 14.7 10*3/uL — ABNORMAL HIGH (ref 4.0–10.5)

## 2011-03-08 LAB — INFLUENZA PANEL BY PCR (TYPE A & B)
H1N1 flu by pcr: NOT DETECTED
Influenza A By PCR: NEGATIVE
Influenza B By PCR: NEGATIVE

## 2011-03-08 MED ORDER — IPRATROPIUM BROMIDE 0.02 % IN SOLN: 0.5000 mg | RESPIRATORY_TRACT | Status: DC | PRN

## 2011-03-08 MED ORDER — ONDANSETRON HCL 4 MG/2ML IJ SOLN: 4.0000 mg | Freq: Four times a day (QID) | INTRAMUSCULAR | Status: DC | PRN

## 2011-03-08 MED ORDER — ALBUTEROL SULFATE (5 MG/ML) 0.5% IN NEBU
2.5000 mg | INHALATION_SOLUTION | Freq: Four times a day (QID) | RESPIRATORY_TRACT | Status: DC
  Administered 2011-03-08 – 2011-03-09 (×3): 2.5 mg via RESPIRATORY_TRACT
  Filled 2011-03-08 (×4): qty 0.5

## 2011-03-08 MED ORDER — OSELTAMIVIR PHOSPHATE 75 MG PO CAPS
75.0000 mg | ORAL_CAPSULE | Freq: Two times a day (BID) | ORAL | Status: DC
  Filled 2011-03-08 (×2): qty 1

## 2011-03-08 MED ORDER — ACETAMINOPHEN 650 MG RE SUPP: 650.0000 mg | Freq: Four times a day (QID) | RECTAL | Status: DC | PRN

## 2011-03-08 MED ORDER — POTASSIUM CHLORIDE 20 MEQ PO PACK: 60.0000 meq | PACK | Freq: Two times a day (BID) | ORAL | Status: DC

## 2011-03-08 MED ORDER — ENOXAPARIN SODIUM 40 MG/0.4ML ~~LOC~~ SOLN
40.0000 mg | SUBCUTANEOUS | Status: DC
  Administered 2011-03-08 – 2011-03-10 (×3): 40 mg via SUBCUTANEOUS
  Filled 2011-03-08 (×3): qty 0.4

## 2011-03-08 MED ORDER — POTASSIUM CHLORIDE CRYS ER 20 MEQ PO TBCR
60.0000 meq | EXTENDED_RELEASE_TABLET | Freq: Two times a day (BID) | ORAL | Status: DC
  Administered 2011-03-08 – 2011-03-10 (×5): 60 meq via ORAL
  Filled 2011-03-08 (×6): qty 3

## 2011-03-08 MED ORDER — IPRATROPIUM BROMIDE 0.02 % IN SOLN
0.5000 mg | Freq: Four times a day (QID) | RESPIRATORY_TRACT | Status: DC
  Filled 2011-03-08: qty 2.5

## 2011-03-08 MED ORDER — SODIUM CHLORIDE 0.9 % IV SOLN
INTRAVENOUS | Status: DC
  Administered 2011-03-08: 04:00:00 via INTRAVENOUS

## 2011-03-08 MED ORDER — ONDANSETRON HCL 4 MG PO TABS: 4.0000 mg | ORAL_TABLET | Freq: Four times a day (QID) | ORAL | Status: DC | PRN

## 2011-03-08 MED ORDER — ALBUTEROL SULFATE (5 MG/ML) 0.5% IN NEBU: 2.5000 mg | INHALATION_SOLUTION | RESPIRATORY_TRACT | Status: DC | PRN

## 2011-03-08 MED ORDER — DEXTROSE 5 % IV SOLN
500.0000 mg | INTRAVENOUS | Status: DC
  Administered 2011-03-08: 500 mg via INTRAVENOUS
  Filled 2011-03-08 (×2): qty 500

## 2011-03-08 MED ORDER — ACETAMINOPHEN 325 MG PO TABS
650.0000 mg | ORAL_TABLET | Freq: Four times a day (QID) | ORAL | Status: DC | PRN
  Administered 2011-03-08: 650 mg via ORAL
  Filled 2011-03-08: qty 2

## 2011-03-08 MED ORDER — OXYCODONE HCL 5 MG PO TABS
5.0000 mg | ORAL_TABLET | ORAL | Status: DC | PRN
  Administered 2011-03-08 – 2011-03-09 (×3): 5 mg via ORAL
  Filled 2011-03-08 (×3): qty 1

## 2011-03-08 MED ORDER — GUAIFENESIN ER 600 MG PO TB12
600.0000 mg | ORAL_TABLET | Freq: Two times a day (BID) | ORAL | Status: DC
  Administered 2011-03-08 – 2011-03-10 (×5): 600 mg via ORAL
  Filled 2011-03-08 (×6): qty 1

## 2011-03-08 MED ORDER — DEXTROSE 5 % IV SOLN
1.0000 g | INTRAVENOUS | Status: DC
  Administered 2011-03-08: 1 g via INTRAVENOUS
  Filled 2011-03-08 (×2): qty 10

## 2011-03-08 NOTE — H&P (Signed)
Alec Grant is an 35 y.o. male.   PCP - None Chief Complaint: Shortness of breath. HPI: 35 year old male been ongoing tobacco abuse present in the ER 5 days ago with fever chills and shortness of breath and cough. At that time he had some chest x-rays which did not show anything acute and was given cough syrup and discharged. He comes back yesterday again because of his persistent shortness of breath cough fever chills and myalgia. Repeat chest x-ray shows bilateral infiltrates and as patient becomes easily short of breath patient and admit for further observation. In addition patient has been having generalized myalgia the denies any vomiting or pain or diarrhea chest pain.  Past Medical History  Diagnosis Date  . Chronic bronchitis     pt doesnt remember date    History reviewed. No pertinent past surgical history.  Family History  Problem Relation Age of Onset  . Stroke Mother   . Diabetes type II Mother    Social History:  reports that he has been smoking Cigarettes.  He has a 22.5 pack-year smoking history. He does not have any smokeless tobacco history on file. He reports that he uses illicit drugs (Marijuana) about 4 times per week. He reports that he does not drink alcohol.  Allergies: No Known Allergies  Medications Prior to Admission  Medication Dose Route Frequency Provider Last Rate Last Dose  . 0.9 %  sodium chloride infusion   Intravenous Continuous Eduard Clos      . acetaminophen (TYLENOL) tablet 650 mg  650 mg Oral Q6H PRN Eduard Clos       Or  . acetaminophen (TYLENOL) suppository 650 mg  650 mg Rectal Q6H PRN Eduard Clos      . acetaminophen (TYLENOL) tablet 650 mg  650 mg Oral Once [employer], PA   650 mg at 03/07/11 2151  . albuterol (PROVENTIL) (5 MG/ML) 0.5% nebulizer solution 2.5 mg  2.5 mg Nebulization Q6H Eduard Clos      . albuterol (PROVENTIL) (5 MG/ML) 0.5% nebulizer solution 2.5 mg  2.5 mg Nebulization  Q2H PRN Eduard Clos      . albuterol (PROVENTIL) (5 MG/ML) 0.5% nebulizer solution 5 mg  5 mg Nebulization Once Tatyana A Kirichenko, PA   5 mg at 03/08/11 0001  . azithromycin (ZITHROMAX) 500 mg in dextrose 5 % 250 mL IVPB  500 mg Intravenous Once Tatyana A Kirichenko, PA   500 mg at 03/08/11 0001  . azithromycin (ZITHROMAX) 500 mg in dextrose 5 % 250 mL IVPB  500 mg Intravenous Q24H Eduard Clos      . cefTRIAXone (ROCEPHIN) 1 g in dextrose 5 % 50 mL IVPB  1 g Intravenous Once Tatyana A Kirichenko, PA      . cefTRIAXone (ROCEPHIN) 1 g in dextrose 5 % 50 mL IVPB  1 g Intravenous Q24H Eduard Clos      . enoxaparin (LOVENOX) injection 40 mg  40 mg Subcutaneous Q24H Eduard Clos      . ketorolac (TORADOL) injection 60 mg  60 mg Intramuscular Once Tatyana A Kirichenko, PA   60 mg at 03/07/11 2151  . ondansetron (ZOFRAN) tablet 4 mg  4 mg Oral Q6H PRN Eduard Clos       Or  . ondansetron (ZOFRAN) injection 4 mg  4 mg Intravenous Q6H PRN Eduard Clos      . oseltamivir (TAMIFLU) capsule 75 mg  75 mg Oral BID Masiya Claassen  Demetra Shiner      . potassium chloride (KLOR-CON) packet 60 mEq  60 mEq Oral BID Eduard Clos      . potassium chloride SA (K-DUR,KLOR-CON) CR tablet 40 mEq  40 mEq Oral Once [employer], PA   40 mEq at 03/07/11 2307  . predniSONE (DELTASONE) tablet 60 mg  60 mg Oral Once Tatyana A Kirichenko, PA   60 mg at 03/08/11 0001  . sodium chloride 0.9 % bolus 1,000 mL  1,000 mL Intravenous Once Tatyana A Kirichenko, PA   1,000 mL at 03/07/11 2330   Medications Prior to Admission  Medication Sig Dispense Refill  . chlorpheniramine-HYDROcodone (TUSSIONEX PENNKINETIC ER) 10-8 MG/5ML LQCR Take 5 mLs by mouth at bedtime as needed (cough).  80 mL  0  . Phenyleph-CPM-DM-APAP (ALKA-SELTZER PLUS COLD & FLU PO) Take 1 tablet by mouth daily as needed. For cold/flu symptoms         Results for orders placed during the hospital encounter of  03/07/11 (from the past 48 hour(s))  CBC     Status: Abnormal   Collection Time   03/07/11  9:35 PM      Component Value Range Comment   WBC 9.2  4.0 - 10.5 (K/uL)    RBC 5.08  4.22 - 5.81 (MIL/uL)    Hemoglobin 15.5  13.0 - 17.0 (g/dL)    HCT 45.4  09.8 - 11.9 (%)    MCV 86.0  78.0 - 100.0 (fL)    MCH 30.5  26.0 - 34.0 (pg)    MCHC 35.5  30.0 - 36.0 (g/dL)    RDW 14.7  82.9 - 56.2 (%)    Platelets 121 (*) 150 - 400 (K/uL)   DIFFERENTIAL     Status: Abnormal   Collection Time   03/07/11  9:35 PM      Component Value Range Comment   Neutrophils Relative 78 (*) 43 - 77 (%)    Neutro Abs 7.2  1.7 - 7.7 (K/uL)    Lymphocytes Relative 15  12 - 46 (%)    Lymphs Abs 1.3  0.7 - 4.0 (K/uL)    Monocytes Relative 7  3 - 12 (%)    Monocytes Absolute 0.6  0.1 - 1.0 (K/uL)    Eosinophils Relative 0  0 - 5 (%)    Eosinophils Absolute 0.0  0.0 - 0.7 (K/uL)    Basophils Relative 0  0 - 1 (%)    Basophils Absolute 0.0  0.0 - 0.1 (K/uL)   POCT I-STAT, CHEM 8     Status: Abnormal   Collection Time   03/07/11 10:02 PM      Component Value Range Comment   Sodium 136  135 - 145 (mEq/L)    Potassium 2.8 (*) 3.5 - 5.1 (mEq/L)    Chloride 102  96 - 112 (mEq/L)    BUN 12  6 - 23 (mg/dL)    Creatinine, Ser 1.30 (*) 0.50 - 1.35 (mg/dL)    Glucose, Bld 865 (*) 70 - 99 (mg/dL)    Calcium, Ion 7.84 (*) 1.12 - 1.32 (mmol/L)    TCO2 25  0 - 100 (mmol/L)    Hemoglobin 16.3  13.0 - 17.0 (g/dL)    HCT 69.6  29.5 - 28.4 (%)   URINALYSIS, MICROSCOPIC ONLY     Status: Abnormal   Collection Time   03/08/11 12:04 AM      Component Value Range Comment   Color, Urine YELLOW  YELLOW  APPearance CLEAR  CLEAR     Specific Gravity, Urine 1.029  1.005 - 1.030     pH 5.5  5.0 - 8.0     Glucose, UA NEGATIVE  NEGATIVE (mg/dL)    Hgb urine dipstick NEGATIVE  NEGATIVE     Bilirubin Urine NEGATIVE  NEGATIVE     Ketones, ur 15 (*) NEGATIVE (mg/dL)    Protein, ur 409 (*) NEGATIVE (mg/dL)    Urobilinogen, UA 1.0   0.0 - 1.0 (mg/dL)    Nitrite NEGATIVE  NEGATIVE     Leukocytes, UA NEGATIVE  NEGATIVE     WBC, UA 0-2  <3 (WBC/hpf)    RBC / HPF 0-2  <3 (RBC/hpf)    Bacteria, UA RARE  RARE     Squamous Epithelial / LPF RARE  RARE     Dg Chest 2 View  03/07/2011  *RADIOLOGY REPORT*  Clinical Data: Fever.  Bodyaches and cough  CHEST - 2 VIEW  Comparison: 03/04/2011  Findings: Heart size is normal.  There is no pleural effusion or pulmonary edema.  Airspace opacification within the right middle lobe and left lower lobe identified.  New from previous exam.  Upper lobes appear clear.  Visualized osseous structures are unremarkable.  IMPRESSION:  1.  Bibasilar airspace opacities consistent with multifocal pneumonia.  Original Report Authenticated By: Rosealee Albee, M.D.    Review of Systems  Constitutional: Positive for fever.  HENT: Negative.   Eyes: Negative.   Respiratory: Positive for cough, shortness of breath and wheezing.   Cardiovascular: Negative.   Gastrointestinal: Negative.   Genitourinary: Negative.   Musculoskeletal: Positive for myalgias.  Skin: Negative.   Neurological: Negative.   Endo/Heme/Allergies: Negative.   Psychiatric/Behavioral: Negative.     Blood pressure 140/94, pulse 66, temperature 97.9 F (36.6 C), temperature source Oral, resp. rate 20, height 6\' 3"  (1.905 m), weight 102.7 kg (226 lb 6.6 oz), SpO2 95.00%. Physical Exam  Constitutional: He is oriented to person, place, and time. He appears well-developed and well-nourished. No distress.  HENT:  Head: Normocephalic and atraumatic.  Right Ear: External ear normal.  Left Ear: External ear normal.  Nose: Nose normal.  Mouth/Throat: Oropharynx is clear and moist. No oropharyngeal exudate.  Eyes: Conjunctivae and EOM are normal. Pupils are equal, round, and reactive to light. Right eye exhibits no discharge. Left eye exhibits no discharge. No scleral icterus.  Neck: Normal range of motion. Neck supple.  Cardiovascular:  Normal rate, regular rhythm and normal heart sounds.   Respiratory: Effort normal. No respiratory distress. He has wheezes.  GI: Soft. Bowel sounds are normal. He exhibits no distension. There is no tenderness. There is no rebound.  Musculoskeletal: Normal range of motion. He exhibits no edema and no tenderness.  Neurological: He is alert and oriented to person, place, and time. He has normal reflexes. No cranial nerve deficit. Coordination normal.  Skin: Skin is warm and dry. He is not diaphoretic.  Psychiatric: His behavior is normal.     Assessment/Plan #1. Pneumonia - most likely to be viral. We'll check flu PCR. At this time we will treat as community-acquired pneumonia with ceftriaxone and Zithromax. We'll also Tamiflu. Patient states for the last 6 years this is the third time he is getting pneumonia. So we will check an HIV antibody test. #2. Ongoing tobacco abuse - tobacco cessation counseling. #3. Elevated blood pressure with mild proteinuria - the need to observe his blood pressure while in hospital. If he continues to be hypertensive  then we need to start antihypertensives. We will replace potassium. Given that patient has hyper-tension and hypokalemia we may have to rule out primary hyperaldosteronism.  CODE STATUS - full code.  Cleto Claggett N. 03/08/2011, 3:16 AM

## 2011-03-08 NOTE — ED Notes (Signed)
Pt stood up to ambulate down the hallway...pt nvr made it out the room....stated he felt light headed and legs hurt.  Pt's girlfriend in background yelling out telling him not to walk if he is hurting..the patient requested to get back in bed.

## 2011-03-08 NOTE — Progress Notes (Signed)
Subjective: Feels marginally better this morning, still coughing up greenish phlegm.  Objective: Vital signs in last 24 hours: Temp:  [97.9 F (36.6 C)-100.4 F (38 C)] 98.7 F (37.1 C) (12/28 0454) Pulse Rate:  [66-95] 70  (12/28 0832) Resp:  [18-24] 18  (12/28 0832) BP: (140-162)/(88-94) 140/94 mmHg (12/28 0240) SpO2:  [92 %-99 %] 97 % (12/28 0832) Weight:  [102.7 kg (226 lb 6.6 oz)] 226 lb 6.6 oz (102.7 kg) (12/28 0240) Weight change:  Last BM Date: 03/07/11  Intake/Output from previous day: 12/27 0701 - 12/28 0700 In: 240 [P.O.:240] Out: 350 [Urine:350]     Physical Exam: General: Comfortable, alert, communicative, fully oriented, not short of breath at rest.  HEENT:  No clinical pallor, no jaundice, no conjunctival injection or discharge. NECK:  Supple, JVP not seen, no carotid bruits, no palpable lymphadenopathy, no palpable goiter. CHEST:  Has bibasal crackles and expiratory rhonchi. HEART:  Sounds 1 and 2 heard, normal, regular, no murmurs. ABDOMEN:  Flat, soft, non-tender, no palpable organomegaly, no palpable masses, normal bowel sounds. GENITALIA:  Not examined. LOWER EXTREMITIES:  No pitting edema, palpable peripheral pulses. MUSCULOSKELETAL SYSTEM:  Generalized osteoarthritic changes, otherwise, normal. CENTRAL NERVOUS SYSTEM:  No focal neurologic deficit on gross examination.  Lab Results:  [facility] 03/08/11 1014 03/08/11 0445  WBC 14.7* 14.1*  HGB 14.6 14.8  HCT 42.2 41.8  PLT 122* 120*    Basename 03/08/11 0445 03/07/11 2202  NA -- 136  K -- 2.8*  CL -- 102  CO2 -- --  GLUCOSE -- 109*  BUN -- 12  CREATININE 1.24 1.40*  CALCIUM -- --   No results found for this or any previous visit (from the past 240 hour(s)).   Studies/Results: Dg Chest 2 View  03/07/2011  *RADIOLOGY REPORT*  Clinical Data: Fever.  Bodyaches and cough  CHEST - 2 VIEW  Comparison: 03/04/2011  Findings: Heart size is normal.  There is no pleural effusion or pulmonary edema.   Airspace opacification within the right middle lobe and left lower lobe identified.  New from previous exam.  Upper lobes appear clear.  Visualized osseous structures are unremarkable.  IMPRESSION:  1.  Bibasilar airspace opacities consistent with multifocal pneumonia.  Original Report Authenticated By: Rosealee Albee, M.D.    Medications: Scheduled Meds:   . acetaminophen  650 mg Oral Once  . albuterol  2.5 mg Nebulization Q6H  . albuterol  5 mg Nebulization Once  . azithromycin (ZITHROMAX) 500 MG IVPB  500 mg Intravenous Once  . azithromycin  500 mg Intravenous Q24H  . cefTRIAXone (ROCEPHIN)  IV  1 g Intravenous Once  . cefTRIAXone (ROCEPHIN)  IV  1 g Intravenous Q24H  . enoxaparin  40 mg Subcutaneous Q24H  . ketorolac  60 mg Intramuscular Once  . oseltamivir  75 mg Oral BID  . potassium chloride  40 mEq Oral Once  . potassium chloride  60 mEq Oral BID  . predniSONE  60 mg Oral Once  . sodium chloride  1,000 mL Intravenous Once  . DISCONTD: potassium chloride  60 mEq Oral BID   Continuous Infusions:   . sodium chloride 100 mL/hr at 03/08/11 0352   PRN Meds:.acetaminophen, acetaminophen, albuterol, ondansetron (ZOFRAN) IV, ondansetron  Assessment/Plan:  Principal Problem:  *Pneumonia: This multi-focal/bilateral community acquired. Patient is now on day# 2 Rocephin/Azithromycin, which we shall continue. Also, mucinex, bronchodilators, for supportive management. Flu test is negative, so Tamiflu will be discontinued accordingly. I agree with HIV screen. *Active Problems*  1. Tobacco use: Patient smokes a half pack to one pack of cigarettes/day. He has been counseled appropriately, but declined Nicoderm CQ patch.  Comment: Hydration is fair, and patient is eating/drinking. I have discontinued iv fluids.  LOS: 1 day   Leticia Coletta,[place] 03/08/2011, 11:25 AM

## 2011-03-08 NOTE — Progress Notes (Signed)
Utilization Review Completed.Alec Grant T12/28/2012   

## 2011-03-09 DIAGNOSIS — I10 Essential (primary) hypertension: Secondary | ICD-10-CM | POA: Diagnosis present

## 2011-03-09 LAB — BASIC METABOLIC PANEL
Chloride: 106 mEq/L (ref 96–112)
Creatinine, Ser: 1.14 mg/dL (ref 0.50–1.35)
GFR calc Af Amer: >90 mL/min (ref >90)
GFR calc non Af Amer: 81 mL/min — ABNORMAL LOW (ref >90)
Potassium: 3.8 mEq/L (ref 3.5–5.1)

## 2011-03-09 LAB — CBC
MCHC: 34.1 g/dL (ref 30.0–36.0)
Platelets: 110 10*3/uL — ABNORMAL LOW (ref 150–400)
RDW: 13.3 % (ref 11.5–15.5)
WBC: 11.2 10*3/uL — ABNORMAL HIGH (ref 4.0–10.5)

## 2011-03-09 MED ORDER — LEVOFLOXACIN 750 MG PO TABS
750.0000 mg | ORAL_TABLET | ORAL | Status: DC
  Administered 2011-03-09: 750 mg via ORAL
  Filled 2011-03-09 (×2): qty 1

## 2011-03-09 MED ORDER — ALBUTEROL SULFATE (5 MG/ML) 0.5% IN NEBU
2.5000 mg | INHALATION_SOLUTION | Freq: Three times a day (TID) | RESPIRATORY_TRACT | Status: DC
  Administered 2011-03-10 (×2): 2.5 mg via RESPIRATORY_TRACT
  Filled 2011-03-09 (×3): qty 0.5

## 2011-03-09 MED ORDER — HYDRALAZINE HCL 20 MG/ML IJ SOLN
10.0000 mg | Freq: Three times a day (TID) | INTRAMUSCULAR | Status: DC | PRN
  Administered 2011-03-09: 10 mg via INTRAVENOUS
  Filled 2011-03-09 (×2): qty 0.5

## 2011-03-09 MED ORDER — LISINOPRIL 10 MG PO TABS
10.0000 mg | ORAL_TABLET | Freq: Every day | ORAL | Status: DC
  Administered 2011-03-09 – 2011-03-10 (×2): 10 mg via ORAL
  Filled 2011-03-09 (×2): qty 1

## 2011-03-09 MED ORDER — ALBUTEROL SULFATE (5 MG/ML) 0.5% IN NEBU
2.5000 mg | INHALATION_SOLUTION | Freq: Four times a day (QID) | RESPIRATORY_TRACT | Status: DC
  Administered 2011-03-09 (×2): 2.5 mg via RESPIRATORY_TRACT
  Filled 2011-03-09 (×2): qty 0.5

## 2011-03-09 MED ORDER — ALBUTEROL SULFATE (5 MG/ML) 0.5% IN NEBU: 2.5000 mg | INHALATION_SOLUTION | Freq: Two times a day (BID) | RESPIRATORY_TRACT | Status: DC

## 2011-03-09 NOTE — Progress Notes (Signed)
Subjective: Patient states that he feels much better today. Ambulating in halls. Still has a productive cough. Objective: Filed Vitals:   03/09/11 1424 03/09/11 1514 03/09/11 1630 03/09/11 1722  BP: 151/103  172/102 154/92  Pulse: 52     Temp: 98.4 F (36.9 C)     TempSrc: Oral     Resp: 16     Height:      Weight:      SpO2: 99% 98%     Weight change:   Intake/Output Summary (Last 24 hours) at 03/09/11 1925 Last data filed at 03/09/11 1303  Gross per 24 hour  Intake    720 ml  Output   1775 ml  Net  -1055 ml    General: Alert, awake, oriented x3, in no acute distress.  HEENT: [place]/AT PEERL, EOMI Neck: Trachea midline,  no masses, no thyromegal,y no JVD, no carotid bruit OROPHARYNX:  Moist, No exudate/ erythema/lesions.  Heart: Regular rate and rhythm, without murmurs, rubs, gallops, PMI non-displaced, no heaves or thrills on palpation.  Lungs: Mild bi-basilar rhonchi noted. No increased vocal fremitus resonant to percussion  Abdomen: Soft, nontender, nondistended, positive bowel sounds, no masses no hepatosplenomegaly noted..  Neuro: No focal neurological deficits noted cranial nerves II through XII grossly intact. DTRs 2+ bilaterally upper and lower extremities. Strength 5 out of 5 in bilateral upper and lower extremities. Musculoskeletal: No warm swelling or erythema around joints, no spinal tenderness noted. Psychiatric: Patient alert and oriented x3, good insight and cognition, good recent to remote recall. Lymph node survey: No cervical axillary or inguinal lymphadenopathy noted.     Lab Results:  Basename 03/09/11 0600 03/08/11 1014  NA 140 137  K 3.8 3.6  CL 106 102  CO2 24 24  GLUCOSE 101* 129*  BUN 13 14  CREATININE 1.14 1.11  CALCIUM 8.3* 9.0  MG -- --  PHOS -- --    Basename 03/08/11 1014  AST 43*  ALT 27  ALKPHOS 54  BILITOT 0.2*  PROT 7.1  ALBUMIN 3.3*   No results found for this basename: LIPASE:2,AMYLASE:2 in the last 72 hours  Basename  03/09/11 0600 03/08/11 1014 03/07/11 2135  WBC 11.2* 14.7* --  NEUTROABS -- -- 7.2  HGB 13.7 14.6 --  HCT 40.2 42.2 --  MCV 88.5 87.0 --  PLT 110* 122* --   No results found for this basename: CKTOTAL:3,CKMB:3,CKMBINDEX:3,TROPONINI:3 in the last 72 hours No components found with this basename: POCBNP:3 No results found for this basename: DDIMER:2 in the last 72 hours No results found for this basename: HGBA1C:2 in the last 72 hours No results found for this basename: CHOL:2,HDL:2,LDLCALC:2,TRIG:2,CHOLHDL:2,LDLDIRECT:2 in the last 72 hours No results found for this basename: TSH,T4TOTAL,FREET3,T3FREE,THYROIDAB in the last 72 hours No results found for this basename: VITAMINB12:2,FOLATE:2,FERRITIN:2,TIBC:2,IRON:2,RETICCTPCT:2 in the last 72 hours  Micro Results: No results found for this or any previous visit (from the past 240 hour(s)).  Studies/Results: Dg Chest 2 View  03/07/2011  *RADIOLOGY REPORT*  Clinical Data: Fever.  Bodyaches and cough  CHEST - 2 VIEW  Comparison: 03/04/2011  Findings: Heart size is normal.  There is no pleural effusion or pulmonary edema.  Airspace opacification within the right middle lobe and left lower lobe identified.  New from previous exam.  Upper lobes appear clear.  Visualized osseous structures are unremarkable.  IMPRESSION:  1.  Bibasilar airspace opacities consistent with multifocal pneumonia.  Original Report Authenticated By: Rosealee Albee, M.D.   Dg Chest 2 View  03/04/2011  *  RADIOLOGY REPORT*  Clinical Data: Cough, myalgia is  CHEST - 2 VIEW  Comparison: 10/29/2010  Findings: Cardiomediastinal silhouette is within normal limits. The lungs are clear. No pleural effusion.  No pneumothorax.  No acute osseous abnormality.  IMPRESSION: Normal chest.  Original Report Authenticated By: Harrel Lemon, M.D.    Medications: I have reviewed the patient's current medications. Scheduled Meds:   . albuterol  2.5 mg Nebulization TID  . azithromycin   500 mg Intravenous Q24H  . cefTRIAXone (ROCEPHIN)  IV  1 g Intravenous Q24H  . enoxaparin  40 mg Subcutaneous Q24H  . guaiFENesin  600 mg Oral BID  . lisinopril  10 mg Oral Daily  . potassium chloride  60 mEq Oral BID  . DISCONTD: albuterol  2.5 mg Nebulization Q6H  . DISCONTD: albuterol  2.5 mg Nebulization BID  . DISCONTD: albuterol  2.5 mg Nebulization Q6H   Continuous Infusions:  PRN Meds:.acetaminophen, acetaminophen, albuterol, hydrALAZINE, ipratropium, ondansetron (ZOFRAN) IV, ondansetron, oxyCODONE Assessment/Plan: Patient Active Hospital Problem List: Pneumonia (03/08/2011)   Assessment: Clinically improved. 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 He started on lisinopril on a scheduled basis and we will further titrate his medications as necessary.   Anticipate discharge within the next 24-48 hours.   LOS: 2 days

## 2011-03-09 NOTE — Progress Notes (Signed)
Pt BP at 1430 was 151/103. Called MD and received order to administer 10 mg Hydralazine IV. Administered at 1550. Will continue to monitor BP. Alec Grant

## 2011-03-10 ENCOUNTER — Encounter: Payer: Self-pay | Admitting: Internal Medicine

## 2011-03-10 MED ORDER — LISINOPRIL 10 MG PO TABS: 10.0000 mg | ORAL_TABLET | Freq: Every day | ORAL | Status: DC

## 2011-03-10 MED ORDER — LEVOFLOXACIN 750 MG PO TABS: 750.0000 mg | ORAL_TABLET | ORAL | Status: DC

## 2011-03-10 MED ORDER — LEVOFLOXACIN 750 MG PO TABS: 750.0000 mg | ORAL_TABLET | ORAL | Status: AC

## 2011-03-10 NOTE — Progress Notes (Signed)
Pt had a 6 beat run of V tach. Pt asymptomatic, lying in bed. Vital signs stable. Rhythm strip on paper chart. Will continue to monitor.  Alfonso Ellis, RN 03/10/2011 0300

## 2011-03-10 NOTE — Progress Notes (Signed)
Pt discharged home with girlfriend. IV site d/c. Site WNL. Discharge instructions complete and patient education complete. Prescriptions filled in main pharmacy and sent with patient. Pt had no further questions. D/C via wheelchair. Dion Saucier

## 2011-03-10 NOTE — Discharge Summary (Signed)
Alec Grant MRN: [id number] DOB/AGE: Feb 20, 1976 35 y.o.  Admit date: 03/07/2011 Discharge date: 03/10/2011  Primary Care Physician:  No primary provider on file.   Discharge Diagnoses:   Patient Active Problem List  Diagnoses  . Pneumonia  . HTN (hypertension), malignant    DISCHARGE MEDICATION: Current Discharge Medication List    START taking these medications   Details  levofloxacin (LEVAQUIN) 750 MG tablet Take 1 tablet (750 mg total) by mouth daily. Qty: 4 tablet, Refills: 0    lisinopril (PRINIVIL,ZESTRIL) 10 MG tablet Take 1 tablet (10 mg total) by mouth daily. Qty: 30 tablet, Refills: 0      CONTINUE these medications which have NOT CHANGED   Details  chlorpheniramine-HYDROcodone (TUSSIONEX PENNKINETIC ER) 10-8 MG/5ML LQCR Take 5 mLs by mouth at bedtime as needed (cough). Qty: 80 mL, Refills: 0      STOP taking these medications     Phenyleph-CPM-DM-APAP (ALKA-SELTZER PLUS COLD & FLU PO)            Consults:     SIGNIFICANT DIAGNOSTIC STUDIES:  Dg Chest 2 View  03/07/2011  *RADIOLOGY REPORT*  Clinical Data: Fever.  Bodyaches and cough  CHEST - 2 VIEW  Comparison: 03/04/2011  Findings: Heart size is normal.  There is no pleural effusion or pulmonary edema.  Airspace opacification within the right middle lobe and left lower lobe identified.  New from previous exam.  Upper lobes appear clear.  Visualized osseous structures are unremarkable.  IMPRESSION:  1.  Bibasilar airspace opacities consistent with multifocal pneumonia.  Original Report Authenticated By: Rosealee Albee, M.D.   Dg Chest 2 View  03/04/2011  *RADIOLOGY REPORT*  Clinical Data: Cough, myalgia is  CHEST - 2 VIEW  Comparison: 10/29/2010  Findings: Cardiomediastinal silhouette is within normal limits. The lungs are clear. No pleural effusion.  No pneumothorax.  No acute osseous abnormality.  IMPRESSION: Normal chest.  Original Report Authenticated By: Harrel Lemon, M.D.            No results found for this or any previous visit (from the past 240 hour(s)).  BRIEF ADMITTING H & P: 35 year old male been ongoing tobacco abuse present in the ER 5 days ago with fever chills and shortness of breath and cough. At that time he had some chest x-rays which did not show anything acute and was given cough syrup and discharged. He comes back yesterday again because of his persistent shortness of breath cough fever chills and myalgia. Repeat chest x-ray shows bilateral infiltrates and as patient becomes easily short of breath patient and admit for further observation. In addition patient has been having generalized myalgia the denies any vomiting or pain or diarrhea chest pain.    Hospital Course:  Present on Admission:  .Pneumonia:Pt was started on IV antibiotics with clinical improvement. He was then transitioned to oral Levaquin and is to complete 4 days to total 7 days of therapy.  Marland KitchenHTN (hypertension), malignant: Patient states that he's been total past that he has borderline hypertension. However the patient appears to have full-blown malignant hypertension and received IV hydralazine then was started on lisinopril on a scheduled basis. He will require further titration of his medications as an out patient.    Disposition and Follow-up: Pt does not have a primary care Physician and has been referred to the [facility] Department.  Discharge Orders    Future Orders Please Complete By Expires   Diet - low sodium heart healthy  Activity as tolerated - No restrictions         DISCHARGE EXAM:  General: Alert, awake, oriented x3, in no acute distress.  Vital Signs:Blood pressure 159/96, pulse 50, temperature 97.6 F (36.4 C), temperature source Oral, resp. rate 16, height 6\' 3"  (1.905 m), weight 102.7 kg (226 lb 6.6 oz), SpO2 98.00%. HEENT: /AT PEERL, EOMI  Neck: Trachea midline, no masses, no thyromegal,y no JVD, no carotid bruit   OROPHARYNX: Moist, No exudate/ erythema/lesions.  Heart: Regular rate and rhythm, without murmurs, rubs, gallops, PMI non-displaced, no heaves or thrills on palpation.  Lungs: Mild bi-basilar rhonchi noted. No increased vocal fremitus resonant to percussion  Abdomen: Soft, nontender, nondistended, positive bowel sounds, no masses no hepatosplenomegaly noted..  Neuro: No focal neurological deficits noted cranial nerves II through XII grossly intact. DTRs 2+ bilaterally upper and lower extremities. Strength 5 out of 5 in bilateral upper and lower extremities.  Musculoskeletal: No warm swelling or erythema around joints, no spinal tenderness noted.  Psychiatric: Patient alert and oriented x3, good insight and cognition, good recent to remote recall.  Lymph node survey: No cervical axillary or inguinal lymphadenopathy noted.    Basename 03/09/11 0600 03/08/11 1014  NA 140 137  K 3.8 3.6  CL 106 102  CO2 24 24  GLUCOSE 101* 129*  BUN 13 14  CREATININE 1.14 1.11  CALCIUM 8.3* 9.0  MG -- --  PHOS -- --    Basename 03/08/11 1014  AST 43*  ALT 27  ALKPHOS 54  BILITOT 0.2*  PROT 7.1  ALBUMIN 3.3*   No results found for this basename: LIPASE:2,AMYLASE:2 in the last 72 hours  Basename 03/09/11 0600 03/08/11 1014 03/07/11 2135  WBC 11.2* 14.7* --  NEUTROABS -- -- 7.2  HGB 13.7 14.6 --  HCT 40.2 42.2 --  MCV 88.5 87.0 --  PLT 110* 122* --   Total time for this discharge process including face to face time 30 minutes.   Signed: Lionel Woodberry A. 03/10/2011, 1:37 PM

## 2011-03-11 NOTE — ED Provider Notes (Signed)
Medical screening examination/treatment/procedure(s) were performed by non-physician practitioner and as supervising physician I was immediately available for consultation/collaboration.  Rhylei Mcquaig L Supriya Beaston, MD 03/11/11 1644 

## 2011-03-11 NOTE — Progress Notes (Signed)
CARE MANAGEMENT NOTE 03/11/2011  Patient:  Alec Grant, Alec Grant   Account Number:  [id number]  Date Initiated:  03/11/2011  Documentation initiated by:  [facility]  Subjective/Objective Assessment:   pneumonia, HTN     Action/Plan:   Anticipated DC Date:  03/11/2011   Anticipated DC Plan:  HOME/SELF CARE      DC Planning Services  CM consult  Medication Assistance  [facility]      Choice offered to / List presented to:             Status of service:  In process, will continue to follow Medicare Important Message given?   (If response is "NO", the following Medicare IM given date fields will be blank) Date Medicare IM given:   Date Additional Medicare IM given:    Discharge Disposition:  HOME/SELF CARE  Per UR Regulation:    Comments:  03/11/2011 0940 Contacted [facility] to schedule appt. Placed on hold for extensive period. Will attempt call back to schedule appt. Isidoro Donning RN CCM Case Mgmt phone [phone]  03/11/2011 0930 (03/10/2011 1500 late entry) Contacted main pharmacy and pt is eligible for [employer] medication assistance fund. Discussed with pt fund and use once per year. Pt states he has been to [facility] in the past. He currently does not have any drug coverage. Explained many pharmacy have generic at a discount price for blood pressure medication and that shopping around for the best price. Explained CM will follow up with [facility] to schedule appt. Isidoro Donning RN CCM Case Mgmt phone [phone]

## 2011-05-20 ENCOUNTER — Emergency Department (HOSPITAL_COMMUNITY)
Admission: EM | Admit: 2011-05-20 | Discharge: 2011-05-20 | Disposition: A | Discharge status: Home Health | Payer: Self-pay

## 2011-05-20 ENCOUNTER — Encounter (HOSPITAL_COMMUNITY): Payer: Self-pay | Admitting: *Deleted

## 2011-05-20 NOTE — ED Notes (Signed)
To ed for eval of 'swelling to one side of my rectum' since Friday last week. States he feels like he needs to have BM but can't. Last BM on Saturday. No rectal bleeding.

## 2011-05-21 ENCOUNTER — Encounter (HOSPITAL_COMMUNITY): Payer: Self-pay | Admitting: *Deleted

## 2011-05-21 ENCOUNTER — Emergency Department (HOSPITAL_COMMUNITY)
Admission: EM | Admit: 2011-05-21 | Discharge: 2011-05-21 | Disposition: A | Discharge status: Home / Self Care | Payer: Self-pay | Attending: Emergency Medicine | Admitting: Emergency Medicine

## 2011-05-21 DIAGNOSIS — K645 Perianal venous thrombosis: Secondary | ICD-10-CM

## 2011-05-21 DIAGNOSIS — F17200 Nicotine dependence, unspecified, uncomplicated: Secondary | ICD-10-CM | POA: Insufficient documentation

## 2011-05-21 DIAGNOSIS — K6289 Other specified diseases of anus and rectum: Secondary | ICD-10-CM | POA: Insufficient documentation

## 2011-05-21 HISTORY — DX: Essential (primary) hypertension: I10

## 2011-05-21 HISTORY — DX: Pneumonia, unspecified organism: J18.9

## 2011-05-21 LAB — BASIC METABOLIC PANEL WITH GFR
Calcium: 8.8 mg/dL (ref 8.4–10.5)
GFR calc Af Amer: 82 mL/min — ABNORMAL LOW (ref >90)
GFR calc non Af Amer: 71 mL/min — ABNORMAL LOW (ref >90)
Glucose, Bld: 91 mg/dL (ref 70–99)
Potassium: 3.9 meq/L (ref 3.5–5.1)
Sodium: 136 meq/L (ref 135–145)

## 2011-05-21 LAB — CBC
HCT: 42.2 % (ref 39.0–52.0)
Hemoglobin: 14.6 g/dL (ref 13.0–17.0)
MCH: 30.5 pg (ref 26.0–34.0)
MCHC: 34.6 g/dL (ref 30.0–36.0)
MCV: 88.3 fL (ref 78.0–100.0)
Platelets: 138 10*3/uL — ABNORMAL LOW (ref 150–400)
RBC: 4.78 MIL/uL (ref 4.22–5.81)
RDW: 14.5 % (ref 11.5–15.5)
WBC: 6.6 10*3/uL (ref 4.0–10.5)

## 2011-05-21 LAB — BASIC METABOLIC PANEL
BUN: 7 mg/dL (ref 6–23)
CO2: 24 mEq/L (ref 19–32)
Chloride: 103 mEq/L (ref 96–112)
Creatinine, Ser: 1.27 mg/dL (ref 0.50–1.35)

## 2011-05-21 MED ORDER — OXYCODONE-ACETAMINOPHEN 5-325 MG PO TABS: 2.0000 | ORAL_TABLET | Freq: Once | ORAL | Status: DC

## 2011-05-21 MED ORDER — LIDOCAINE HCL (PF) 1 % IJ SOLN
5.0000 mL | Freq: Once | INTRAMUSCULAR | Status: AC
  Administered 2011-05-21: 1 mL
  Filled 2011-05-21: qty 5

## 2011-05-21 MED ORDER — TRAMADOL-ACETAMINOPHEN 37.5-325 MG PO TABS: 1.0000 | ORAL_TABLET | Freq: Four times a day (QID) | ORAL | Status: AC | PRN

## 2011-05-21 MED ORDER — SODIUM CHLORIDE 0.9 % IV SOLN
Freq: Once | INTRAVENOUS | Status: AC
  Administered 2011-05-21: 08:00:00 via INTRAVENOUS

## 2011-05-21 MED ORDER — OXYCODONE-ACETAMINOPHEN 5-325 MG PO TABS
ORAL_TABLET | ORAL | Status: AC
  Administered 2011-05-21: 2 via ORAL
  Filled 2011-05-21: qty 2

## 2011-05-21 MED ORDER — FENTANYL CITRATE 0.05 MG/ML IJ SOLN
50.0000 ug | Freq: Once | INTRAMUSCULAR | Status: AC
Start: 2011-05-21 — End: 2011-05-21
  Administered 2011-05-21: 50 ug via INTRAVENOUS
  Filled 2011-05-21: qty 2

## 2011-05-21 NOTE — ED Notes (Signed)
Surgery at bedside preparing to perform I&D of hemorrhoid. Procedure explained to patient, had no further questions. Pt tolerated without difficulty. Wound care instructions discussed, area covered with dressing, pt instructed to perform warm water soaks and keep area clean and dry.

## 2011-05-21 NOTE — ED Notes (Signed)
Patient reports he has had pain in his rectum for the past few days.  Today the pain is worse.  He states he feels pressure in his rectum and he noted blood on tissue

## 2011-05-21 NOTE — Consult Note (Signed)
Reason for Consult:Thrombosed hemorrhoids Consulting Surgeon: Cornett Referring Physician: Oletta Lamas   HPI: Alec Grant is an 36 y.o. male with no major medical problems who presents to the ER for c/o rectal pain and bleeding. He's been more constipated recently and also strains a lot for his job. His eval in the ER was concerning for thrombosed hems and surgery consult requested. Denies chills, fever, CO, SOB, Abd pain. No dysuria. No hx GI problems. No prior colonoscopy  Past Medical History:  Past Medical History  Diagnosis Date  . Chronic bronchitis     pt doesnt remember date  . Pneumonia   . Hypertension     Surgical History: History reviewed. No pertinent past surgical history.  Family History:  Family History  Problem Relation Age of Onset  . Stroke Mother   . Diabetes type II Mother     Social History:  reports that he has been smoking Cigarettes.  He has a 22.5 pack-year smoking history. He does not have any smokeless tobacco history on file. He reports that he uses illicit drugs (Marijuana) about 4 times per week. He reports that he does not drink alcohol.  Allergies: No Known Allergies  Medications:  None chronic  ROS: See HPI for pertinent findings, otherwise complete 10 system review negative.  Physical Exam: Blood pressure 175/120, pulse 67, temperature 97.8 F (36.6 C), temperature source Oral, resp. rate 15, SpO2 100.00%.  General Appearance:  Alert, cooperative, no distress, appears stated age  Lungs:   Clear to auscultation bilaterally, no w/r/r, respirations unlabored without use of accessory muscles.  Heart:  Regular rate and rhythm, S1, S2 normal, no murmur, rub or gallop. Carotids 2+ without bruit.  Abdomen:   Soft, non-tender, non distended. Bowel sounds active all four quadrants,  no masses, no organomegaly.  Genitalia:  Normal. No hernias  Rectal:  Tender thrombosed external hems X 2 on (L)aspect with secondary focal edema, no fissure. DRE-no  int hems, nl tone.  Neurologic: Normal affect, no gross deficits.     Labs: CBC  Basename 05/21/11 0747  WBC 6.6  HGB 14.6  HCT 42.2  PLT 138*   MET  Basename 05/21/11 0747  NA 136  K 3.9  CL 103  CO2 24  GLUCOSE 91  BUN 7  CREATININE 1.27  CALCIUM 8.8   No results found for this basename: PROT,ALBUMIN,AST,ALT,ALKPHOS,BILITOT,BILIDIR,IBILI,LIPASE in the last 72 hours PT/INR No results found for this basename: LABPROT:2,INR:2 in the last 72 hours ABG No results found for this basename: PHART:2,PCO2:2,PO2:2,HCO3:2 in the last 72 hours    No results found.  Assessment/Plan: Principal Problem:  *External hemorrhoid, thrombosed  I&D done at bedside, see Op note. Teaching instructions reviewed. Sitz baths, dressing prn drainage. No strenuous activity. Follow up in office.   Marianna Fuss PA-C 05/21/2011, 8:59 AM

## 2011-05-21 NOTE — Consult Note (Signed)
Follow up in office.  Local wound care.

## 2011-05-21 NOTE — Op Note (Signed)
Incision and Drainage Procedure Note  Pre-operative Diagnosis: Thrombosed external hemorrhoids X 2  Post-operative Diagnosis: same  Anesthesia: 1% plain lidocaine  Procedure Details  The procedure, risks and complications have been discussed in detail (including, but not limited to airway compromise, infection, bleeding) with the patient.  The skin was sterilely prepped and draped over the affected area in the usual fashion. After adequate local anesthesia, I&D with a #11 blade was performed to each of the hemorrhoid sites. Fresh clot was expressed from each site, alleviating the pressure. No purulence was encountered. Some bleeding was obviously noted, but controlled with pressure dressing. The patient was observed until stable.    EBL: Minimal   Condition: Tolerated procedure well   Complications: none.  Alec Grant 05/21/2011 9:08 AM

## 2011-05-21 NOTE — ED Provider Notes (Signed)
History     CSN: [id number]  Arrival date & time 05/21/11  8295   First MD Initiated Contact with Patient 05/21/11 [phone]      Chief Complaint  Patient presents with  . Hemorrhoids  . Rectal Pain    (Consider location/radiation/quality/duration/timing/severity/associated sxs/prior treatment) HPI Comments: Pt reports some constipation with hard stool starting last week around Wednesday and then began having rectal pain on Thursday.  He reports having BM's but hard.  No fevers, chills, N/V.  He denies specifically abd pain, but feels uncomfortably generally and has not eaten for past 3 days.  Had loose stool today and saw blood on tissue. He reports feeling swollen area at rectum, but doesn't know what it is.  Denies problems with bleeding or hemorrhoids in the past.  Denies radiation of pain.  Pain has been constant, worse with having BM.  Reports even with urination, feels some pressure in rectal area, has urge to have BM.    The history is provided by the patient.    Past Medical History  Diagnosis Date  . Chronic bronchitis     pt doesnt remember date  . Pneumonia     History reviewed. No pertinent past surgical history.  Family History  Problem Relation Age of Onset  . Stroke Mother   . Diabetes type II Mother     History  Substance Use Topics  . Smoking status: Current Everyday Smoker -- 1.5 packs/day for 15 years    Types: Cigarettes  . Smokeless tobacco: Not on file  . Alcohol Use: No      Review of Systems  Constitutional: Positive for appetite change. Negative for fever and chills.  Gastrointestinal: Positive for constipation, anal bleeding and rectal pain. Negative for nausea, vomiting, abdominal pain, diarrhea and abdominal distention.  Genitourinary: Negative for dysuria, frequency, flank pain, scrotal swelling and testicular pain.  Musculoskeletal: Negative for back pain.    Allergies  Review of patient's allergies indicates no known allergies.  Home  Medications  No current outpatient prescriptions on file.  BP 175/120  Pulse 67  Temp(Src) 97.8 F (36.6 C) (Oral)  Resp 15  SpO2 100%  Physical Exam  Nursing note and vitals reviewed. Constitutional: He is oriented to person, place, and time. He appears well-developed and well-nourished.  HENT:  Head: Normocephalic and atraumatic.  Eyes: Pupils are equal, round, and reactive to light. No scleral icterus.  Neck: Neck supple.  Cardiovascular: Normal rate and regular rhythm.   Pulmonary/Chest: Effort normal and breath sounds normal. No respiratory distress.  Abdominal: Soft. Bowel sounds are normal. He exhibits no distension. There is no tenderness. There is no rebound and no guarding.  Genitourinary: Rectal exam shows external hemorrhoid and tenderness. Rectal exam shows no fissure. Guaiac positive stool.     Neurological: He is alert and oriented to person, place, and time.  Skin: Skin is warm and dry. No rash noted.  Psychiatric: He has a normal mood and affect.    ED Course  Procedures (including critical care time)  Labs Reviewed  CBC - Abnormal; Notable for the following:    Platelets 138 (*)    All other components within normal limits  BASIC METABOLIC PANEL - Abnormal; Notable for the following:    GFR calc non Af Amer 71 (*)    GFR calc Af Amer 82 (*)    All other components within normal limits   No results found.   1. Hemorrhoids thrombosed      8:16  AM Spoke to Dr. Luisa Hart.  He or colleague or PA will see pt in the ED.  Pt's pain is slightly improved with IV fentanyl.     9:27 AM Procedure performed by general surgery.  Discharged by surgery. MDM  Pt with stage 4 hemorrhoids, will discuss with surgery for hemorrhoidectomy.  I think pt's anorexia is likely related to constipation as I am sure pt has not been having appropriate BM's for past few days.  No overt guard or rebound on exam.     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  Pt has been non compliant and is not taking.  Was referred to [facility] department at the time.     Gavin Pound. Oletta Lamas, MD 05/21/11 [phone]

## 2011-05-21 NOTE — ED Notes (Signed)
Pt presents to department for evaluation of rectal pain for several days. Also states bleeding from rectum this morning. Last BM Monday was normal, pt states pain with defecation. States "I think I have a hemorrhoid." denies urinary symptoms. Pt alert and oriented x4. No signs of distress noted.

## 2011-05-21 NOTE — Discharge Instructions (Signed)

## 2011-05-21 NOTE — ED Notes (Signed)
Pt discharged home, not driving himself, friend to transport. Vital signs stable. Had no further questions regarding follow up. Wound care instructions reviewed with patient.

## 2011-08-05 ENCOUNTER — Emergency Department (HOSPITAL_COMMUNITY)
Admission: EM | Admit: 2011-08-05 | Discharge: 2011-08-06 | Disposition: A | Discharge status: Home / Self Care | Payer: Self-pay | Attending: Emergency Medicine | Admitting: Emergency Medicine

## 2011-08-05 ENCOUNTER — Encounter (HOSPITAL_COMMUNITY): Payer: Self-pay | Admitting: Emergency Medicine

## 2011-08-05 DIAGNOSIS — F17200 Nicotine dependence, unspecified, uncomplicated: Secondary | ICD-10-CM | POA: Insufficient documentation

## 2011-08-05 DIAGNOSIS — G43909 Migraine, unspecified, not intractable, without status migrainosus: Secondary | ICD-10-CM | POA: Insufficient documentation

## 2011-08-05 DIAGNOSIS — Z86711 Personal history of pulmonary embolism: Secondary | ICD-10-CM | POA: Insufficient documentation

## 2011-08-05 DIAGNOSIS — R51 Headache: Secondary | ICD-10-CM

## 2011-08-05 DIAGNOSIS — I10 Essential (primary) hypertension: Secondary | ICD-10-CM | POA: Insufficient documentation

## 2011-08-05 HISTORY — DX: Other pulmonary embolism without acute cor pulmonale: I26.99

## 2011-08-05 MED ORDER — SODIUM CHLORIDE 0.9 % IV BOLUS (SEPSIS)
1000.0000 mL | Freq: Once | INTRAVENOUS | Status: AC
  Administered 2011-08-05: 1000 mL via INTRAVENOUS

## 2011-08-05 MED ORDER — KETOROLAC TROMETHAMINE 30 MG/ML IJ SOLN
30.0000 mg | Freq: Once | INTRAMUSCULAR | Status: AC
  Administered 2011-08-05: 30 mg via INTRAMUSCULAR
  Filled 2011-08-05: qty 1

## 2011-08-05 NOTE — ED Notes (Signed)
Patient complaining of migraine that started 6-7 hours ago; patient reports light sensitivity and light-headedness.  Denies nausea/vomiting.  Patient reports history of migraines.

## 2011-08-05 NOTE — ED Notes (Signed)
Pt requesting something to eat and drink,  [place] sandwich and coke given to pt.

## 2011-08-05 NOTE — ED Provider Notes (Signed)
History     CSN: [id number]  Arrival date & time 08/05/11  2057   First MD Initiated Contact with Patient 08/05/11 2206      Chief Complaint  Patient presents with  . Migraine    (Consider location/radiation/quality/duration/timing/severity/associated sxs/prior treatment) HPI Comments: Patient states he is a frontal headache that started earlier today.  He has not taken any over-the-counter medication, stating that he couldn't afford to buy Tylenol, denies any recent illnesses, sinus pressure, congestion, history of headaches  Patient is a 36 y.o. male presenting with migraine. The history is provided by the patient.  Migraine This is a new problem. The current episode started today. The problem occurs constantly. The problem has been unchanged. Associated symptoms include headaches. Pertinent negatives include no chills, congestion, fever, myalgias, nausea, rash or sore throat.    Past Medical History  Diagnosis Date  . Chronic bronchitis     pt doesnt remember date  . Pneumonia   . Hypertension   . Pulmonary embolism     History reviewed. No pertinent past surgical history.  Family History  Problem Relation Age of Onset  . Stroke Mother   . Diabetes type II Mother     History  Substance Use Topics  . Smoking status: Current Everyday Smoker -- 1.5 packs/day for 15 years    Types: Cigarettes  . Smokeless tobacco: Not on file  . Alcohol Use: Yes     Occassional Use      Review of Systems  Constitutional: Negative for fever and chills.  HENT: Negative for congestion, sore throat, rhinorrhea and sinus pressure.   Eyes: Negative for visual disturbance.  Gastrointestinal: Negative for nausea.  Musculoskeletal: Negative for myalgias.  Skin: Negative for rash.  Neurological: Positive for headaches. Negative for dizziness.    Allergies  Review of patient's allergies indicates no known allergies.  Home Medications  No current outpatient prescriptions on  file.  BP 159/102  Pulse 64  Temp(Src) 98.2 F (36.8 C) (Oral)  Resp 18  SpO2 99%  Physical Exam  Constitutional: He appears well-developed and well-nourished.  HENT:  Head: Normocephalic.  Neck: Normal range of motion.  Cardiovascular: Normal rate.   Pulmonary/Chest: Effort normal.  Musculoskeletal: Normal range of motion.  Neurological: He is alert.  Skin: Skin is warm.    ED Course  Procedures (including critical care time)  Labs Reviewed - No data to display No results found.   No diagnosis found.  Patient repeatedly asked for food after he was medicated with Toradol, and a liter IV saline has been sleeping for the past 2 hours  stating his headache is better  MDM  Headache, without history of migraine frontal without history of sinus congestion, rhinitis, history of allergies, trauma        Arman Filter, NP 08/06/11 0126  Arman Filter, NP 08/06/11 0127  Arman Filter, NP 08/06/11 0127

## 2011-08-06 NOTE — Discharge Instructions (Signed)
General Headache, Without Cause A general headache has no specific cause. These headaches are not life-threatening. They will not lead to other types of headaches. HOME CARE   Make and keep follow-up visits with your doctor.   Only take medicine as told by your doctor.   Try to relax, get a massage, or use your thoughts to control your body (biofeedback).   Apply cold or heat to the head and neck. Apply 3 or 4 times a day or as needed.  Finding out the results of your test Ask when your test results will be ready. Make sure you get your test results. GET HELP RIGHT AWAY IF:   You have problems with medicine.   Your medicine does not help relieve pain.   Your headache changes or becomes worse.   You feel sick to your stomach (nauseous) or throw up (vomit).   You have a temperature by mouth above 102 F (38.9 C), not controlled by medicine.   Your have a stiff neck.   You have vision loss.   You have muscle weakness.   You lose control of your muscles.   You lose balance or have trouble walking.   You feel like you are going to pass out (faint).  MAKE SURE YOU:   Understand these instructions.   Will watch this condition.   Will get help right away if you are not doing well or get worse.  Document Released: 12/05/2007 Document Revised: 02/14/2011 Document Reviewed: 12/05/2007 [facility] Patient Information 2012 [place], [place]. You can safely take over-the-counter Tylenol or ibuprofen for headache

## 2011-08-12 NOTE — ED Provider Notes (Signed)
Medical screening examination/treatment/procedure(s) were performed by non-physician practitioner and as supervising physician I was immediately available for consultation/collaboration.  Raeford Razor, MD 08/12/11 [phone]

## 2011-09-16 ENCOUNTER — Emergency Department (HOSPITAL_COMMUNITY)
Admission: EM | Admit: 2011-09-16 | Discharge: 2011-09-16 | Disposition: A | Discharge status: Home / Self Care | Payer: Self-pay | Attending: Emergency Medicine | Admitting: Emergency Medicine

## 2011-09-16 ENCOUNTER — Encounter (HOSPITAL_COMMUNITY): Payer: Self-pay | Admitting: Emergency Medicine

## 2011-09-16 DIAGNOSIS — F17200 Nicotine dependence, unspecified, uncomplicated: Secondary | ICD-10-CM | POA: Insufficient documentation

## 2011-09-16 DIAGNOSIS — R51 Headache: Secondary | ICD-10-CM | POA: Insufficient documentation

## 2011-09-16 DIAGNOSIS — J42 Unspecified chronic bronchitis: Secondary | ICD-10-CM | POA: Insufficient documentation

## 2011-09-16 DIAGNOSIS — I10 Essential (primary) hypertension: Secondary | ICD-10-CM | POA: Insufficient documentation

## 2011-09-16 DIAGNOSIS — G43909 Migraine, unspecified, not intractable, without status migrainosus: Secondary | ICD-10-CM | POA: Insufficient documentation

## 2011-09-16 HISTORY — DX: Migraine, unspecified, not intractable, without status migrainosus: G43.909

## 2011-09-16 LAB — CBC WITH DIFFERENTIAL/PLATELET
Basophils Absolute: 0 10*3/uL (ref 0.0–0.1)
Eosinophils Relative: 1 % (ref 0–5)
HCT: 45.4 % (ref 39.0–52.0)
Lymphocytes Relative: 25 % (ref 12–46)
Lymphs Abs: 3.1 10*3/uL (ref 0.7–4.0)
MCV: 86.1 fL (ref 78.0–100.0)
Monocytes Absolute: 0.7 10*3/uL (ref 0.1–1.0)
Monocytes Relative: 6 % (ref 3–12)
RDW: 13.5 % (ref 11.5–15.5)
WBC: 12.4 10*3/uL — ABNORMAL HIGH (ref 4.0–10.5)

## 2011-09-16 LAB — COMPREHENSIVE METABOLIC PANEL
ALT: 21 U/L (ref 0–53)
AST: 33 U/L (ref 0–37)
Albumin: 4.2 g/dL (ref 3.5–5.2)
Alkaline Phosphatase: 71 U/L (ref 39–117)
BUN: 14 mg/dL (ref 6–23)
CO2: 25 mEq/L (ref 19–32)
Calcium: 9.6 mg/dL (ref 8.4–10.5)
Creatinine, Ser: 1.38 mg/dL — ABNORMAL HIGH (ref 0.50–1.35)
Glucose, Bld: 96 mg/dL (ref 70–99)
Total Bilirubin: 0.4 mg/dL (ref 0.3–1.2)
Total Protein: 7.9 g/dL (ref 6.0–8.3)

## 2011-09-16 MED ORDER — IBUPROFEN 800 MG PO TABS: 800.0000 mg | ORAL_TABLET | Freq: Three times a day (TID) | ORAL | Status: AC

## 2011-09-16 MED ORDER — DEXAMETHASONE SODIUM PHOSPHATE 4 MG/ML IJ SOLN: 10.0000 mg | Freq: Once | INTRAMUSCULAR | Status: AC

## 2011-09-16 MED ORDER — DIPHENHYDRAMINE HCL 50 MG/ML IJ SOLN
25.0000 mg | Freq: Once | INTRAMUSCULAR | Status: AC
  Administered 2011-09-16: 25 mg via INTRAVENOUS
  Filled 2011-09-16: qty 1

## 2011-09-16 MED ORDER — DEXAMETHASONE SODIUM PHOSPHATE 10 MG/ML IJ SOLN
INTRAMUSCULAR | Status: AC
  Administered 2011-09-16: 10 mg
  Filled 2011-09-16: qty 1

## 2011-09-16 MED ORDER — SODIUM CHLORIDE 0.9 % IV BOLUS (SEPSIS)
1000.0000 mL | Freq: Once | INTRAVENOUS | Status: AC
  Administered 2011-09-16: 1000 mL via INTRAVENOUS

## 2011-09-16 MED ORDER — PROMETHAZINE HCL 25 MG/ML IJ SOLN
25.0000 mg | Freq: Once | INTRAMUSCULAR | Status: AC
  Administered 2011-09-16: 25 mg via INTRAVENOUS
  Filled 2011-09-16: qty 1

## 2011-09-16 NOTE — ED Provider Notes (Signed)
History     CSN: [id number]  Arrival date & time 09/16/11  0005   First MD Initiated Contact with Patient 09/16/11 [phone]      Chief Complaint  Patient presents with  . Headache    (Consider location/radiation/quality/duration/timing/severity/associated sxs/prior treatment) Patient is a 36 y.o. male presenting with headaches.  Headache  Pertinent negatives include no fever and no shortness of breath.   Hx per PT. Onset today, L sided, throbbing in nature, no radiation, gradual onset, now severe, photphobia present, no F/C, no neck stiffness, no trauma,. No weakness or numbness. No n/v/d. H/o MHAs and this feels similar.  Past Medical History  Diagnosis Date  . Chronic bronchitis     pt doesnt remember date  . Pneumonia   . Hypertension   . Pulmonary embolism   . Migraines     History reviewed. No pertinent past surgical history.  Family History  Problem Relation Age of Onset  . Stroke Mother   . Diabetes type II Mother     History  Substance Use Topics  . Smoking status: Current Everyday Smoker -- 1.5 packs/day for 15 years    Types: Cigarettes  . Smokeless tobacco: Not on file  . Alcohol Use: Yes     Occassional Use      Review of Systems  Constitutional: Negative for fever and chills.  HENT: Negative for neck pain and neck stiffness.   Eyes: Negative for pain.  Respiratory: Negative for shortness of breath.   Cardiovascular: Negative for chest pain.  Gastrointestinal: Negative for abdominal pain.  Genitourinary: Negative for dysuria.  Musculoskeletal: Negative for back pain.  Skin: Negative for rash.  Neurological: Positive for headaches.  All other systems reviewed and are negative.    Allergies  Review of patient's allergies indicates no known allergies.  Home Medications  No current outpatient prescriptions on file.  BP 151/99  Pulse 67  Temp 98.4 F (36.9 C) (Oral)  Resp 18  SpO2 97%  Physical Exam  Constitutional: He is oriented to  person, place, and time. He appears well-developed and well-nourished.  HENT:  Head: Normocephalic and atraumatic.  Eyes: Conjunctivae and EOM are normal. Pupils are equal, round, and reactive to light.  Neck: Full passive range of motion without pain. Neck supple. No thyromegaly present.       No meningismus  Cardiovascular: Normal rate, regular rhythm, S1 normal, S2 normal and intact distal pulses.   Pulmonary/Chest: Effort normal and breath sounds normal.  Abdominal: Soft. Bowel sounds are normal. There is no tenderness. There is no CVA tenderness.  Musculoskeletal: Normal range of motion.  Neurological: He is alert and oriented to person, place, and time. He has normal strength and normal reflexes. No cranial nerve deficit or sensory deficit. He displays a negative Romberg sign. GCS eye subscore is 4. GCS verbal subscore is 5. GCS motor subscore is 6.       Normal Gait  Skin: Skin is warm and dry. No rash noted. No cyanosis. Nails show no clubbing.  Psychiatric: He has a normal mood and affect. His speech is normal and behavior is normal.    ED Course  Procedures (including critical care time)  Labs Reviewed  CBC WITH DIFFERENTIAL - Abnormal; Notable for the following:    WBC 12.4 (*)     Neutro Abs 8.4 (*)     All other components within normal limits  COMPREHENSIVE METABOLIC PANEL - Abnormal; Notable for the following:    Creatinine, Ser 1.38 (*)  GFR calc non Af Amer 64 (*)     GFR calc Af Amer 74 (*)     All other components within normal limits   IVFs  HA cocktail  Recheck at 03015am - resting comfortably, HA improved, no change normal neuro exam, stable for d/c home.    MDM   Old records reviewed. Nursing notes reviewed. Labs reviewed as above sent by triage nurse.  Headache with history of migraines and presentation suggests the same. Improved with medications as above. No indication for emergent CT scan. No meningismus. Stable for discharge home with outpatient  followup. Prescription for NSAIDs provided          Sunnie Nielsen, MD 09/16/11 [phone]

## 2011-09-16 NOTE — ED Notes (Signed)
Patient complaining of migraine headache; reports history of frequent migraines.  Patient states that he has not taking any mediation for the pain.  Patient reports light sensitivity.  Denies dizziness, chest pain, nausea, vomiting, shortness of breath, and weakness.

## 2012-07-13 ENCOUNTER — Emergency Department (HOSPITAL_BASED_OUTPATIENT_CLINIC_OR_DEPARTMENT_OTHER): Payer: Self-pay

## 2012-07-13 ENCOUNTER — Emergency Department (HOSPITAL_BASED_OUTPATIENT_CLINIC_OR_DEPARTMENT_OTHER)
Admission: EM | Admit: 2012-07-13 | Discharge: 2012-07-13 | Disposition: A | Discharge status: Home / Self Care | Payer: Self-pay | Attending: Emergency Medicine | Admitting: Emergency Medicine

## 2012-07-13 ENCOUNTER — Encounter (HOSPITAL_BASED_OUTPATIENT_CLINIC_OR_DEPARTMENT_OTHER): Payer: Self-pay

## 2012-07-13 DIAGNOSIS — Z8679 Personal history of other diseases of the circulatory system: Secondary | ICD-10-CM | POA: Insufficient documentation

## 2012-07-13 DIAGNOSIS — K7689 Other specified diseases of liver: Secondary | ICD-10-CM | POA: Insufficient documentation

## 2012-07-13 DIAGNOSIS — R109 Unspecified abdominal pain: Secondary | ICD-10-CM | POA: Insufficient documentation

## 2012-07-13 DIAGNOSIS — R197 Diarrhea, unspecified: Secondary | ICD-10-CM | POA: Insufficient documentation

## 2012-07-13 DIAGNOSIS — Z86711 Personal history of pulmonary embolism: Secondary | ICD-10-CM | POA: Insufficient documentation

## 2012-07-13 DIAGNOSIS — F17200 Nicotine dependence, unspecified, uncomplicated: Secondary | ICD-10-CM | POA: Insufficient documentation

## 2012-07-13 DIAGNOSIS — R112 Nausea with vomiting, unspecified: Secondary | ICD-10-CM | POA: Insufficient documentation

## 2012-07-13 DIAGNOSIS — Z8709 Personal history of other diseases of the respiratory system: Secondary | ICD-10-CM | POA: Insufficient documentation

## 2012-07-13 DIAGNOSIS — I10 Essential (primary) hypertension: Secondary | ICD-10-CM | POA: Insufficient documentation

## 2012-07-13 DIAGNOSIS — K769 Liver disease, unspecified: Secondary | ICD-10-CM

## 2012-07-13 DIAGNOSIS — Z8701 Personal history of pneumonia (recurrent): Secondary | ICD-10-CM | POA: Insufficient documentation

## 2012-07-13 LAB — COMPREHENSIVE METABOLIC PANEL
AST: 35 U/L (ref 0–37)
CO2: 27 mEq/L (ref 19–32)
Calcium: 9.6 mg/dL (ref 8.4–10.5)
Creatinine, Ser: 1.3 mg/dL (ref 0.50–1.35)
GFR calc non Af Amer: 68 mL/min — ABNORMAL LOW (ref >90)
Total Protein: 7.5 g/dL (ref 6.0–8.3)

## 2012-07-13 LAB — CBC WITH DIFFERENTIAL/PLATELET
Basophils Absolute: 0 10*3/uL (ref 0.0–0.1)
Basophils Relative: 0 % (ref 0–1)
Eosinophils Absolute: 0 10*3/uL (ref 0.0–0.7)
Eosinophils Relative: 0 % (ref 0–5)
HCT: 43 % (ref 39.0–52.0)
Lymphocytes Relative: 16 % (ref 12–46)
MCH: 30.8 pg (ref 26.0–34.0)
MCHC: 35.6 g/dL (ref 30.0–36.0)
MCV: 86.7 fL (ref 78.0–100.0)
Monocytes Absolute: 0.5 10*3/uL (ref 0.1–1.0)
RDW: 12.7 % (ref 11.5–15.5)

## 2012-07-13 LAB — URINALYSIS, ROUTINE W REFLEX MICROSCOPIC
Hgb urine dipstick: NEGATIVE
Nitrite: NEGATIVE
Specific Gravity, Urine: 1.012 (ref 1.005–1.030)
Urobilinogen, UA: 0.2 mg/dL (ref 0.0–1.0)

## 2012-07-13 MED ORDER — HYDROMORPHONE HCL PF 1 MG/ML IJ SOLN
1.0000 mg | Freq: Once | INTRAMUSCULAR | Status: AC
  Administered 2012-07-13: 1 mg via INTRAVENOUS

## 2012-07-13 MED ORDER — ONDANSETRON HCL 4 MG/2ML IJ SOLN
INTRAMUSCULAR | Status: AC
  Administered 2012-07-13: 4 mg via INTRAVENOUS
  Filled 2012-07-13: qty 2

## 2012-07-13 MED ORDER — SODIUM CHLORIDE 0.9 % IV BOLUS (SEPSIS)
1000.0000 mL | Freq: Once | INTRAVENOUS | Status: AC
  Administered 2012-07-13: 1000 mL via INTRAVENOUS

## 2012-07-13 MED ORDER — ONDANSETRON HCL 4 MG/2ML IJ SOLN
4.0000 mg | Freq: Once | INTRAMUSCULAR | Status: AC
  Administered 2012-07-13: 4 mg via INTRAVENOUS

## 2012-07-13 MED ORDER — KETOROLAC TROMETHAMINE 30 MG/ML IJ SOLN
INTRAMUSCULAR | Status: AC
  Administered 2012-07-13: 30 mg via INTRAVENOUS
  Filled 2012-07-13: qty 1

## 2012-07-13 MED ORDER — HYDROMORPHONE HCL PF 1 MG/ML IJ SOLN
INTRAMUSCULAR | Status: AC
  Administered 2012-07-13: 1 mg via INTRAVENOUS
  Filled 2012-07-13: qty 1

## 2012-07-13 MED ORDER — KETOROLAC TROMETHAMINE 30 MG/ML IJ SOLN
30.0000 mg | Freq: Once | INTRAMUSCULAR | Status: AC
  Administered 2012-07-13: 30 mg via INTRAVENOUS

## 2012-07-13 NOTE — ED Notes (Signed)
Pt states that he cannot provide a urine specimen at this time.  Will check back shortly.    

## 2012-07-13 NOTE — ED Provider Notes (Signed)
History     CSN: [id number]  Arrival date & time 07/13/12  0812   First MD Initiated Contact with Patient 07/13/12 [phone]      Chief Complaint  Patient presents with  . Abdominal Pain    (Consider location/radiation/quality/duration/timing/severity/associated sxs/prior treatment) HPI  37 year old male with right flank pain that began approximately 5:30 this morning. He has had associated nausea vomiting and diarrhea. He has not had a similar pain in the past. It is sharp it is over the right anterior superior iliac crest and radiates down into the groin. He has had no trauma that he knows of. The pain has been continuous and sharp in nature. He can site no increasing or decreasing symptoms. He denies any UTI symptoms including dysuria or frequency. He has had 2 episodes of vomiting of food-like material. He has had multiple episodes of stool that he states were performed without active diarrhea. He has not had any dark stools or blood.  Past Medical History  Diagnosis Date  . Chronic bronchitis     pt doesnt remember date  . Pneumonia   . Hypertension   . Pulmonary embolism   . Migraines     History reviewed. No pertinent past surgical history.  Family History  Problem Relation Age of Onset  . Stroke Mother   . Diabetes type II Mother     History  Substance Use Topics  . Smoking status: Current Every Day Smoker -- 1.50 packs/day for 15 years    Types: Cigarettes  . Smokeless tobacco: Never Used  . Alcohol Use: Yes     Comment: Occassional Use      Review of Systems  All other systems reviewed and are negative.    Allergies  Review of patient's allergies indicates no known allergies.  Home Medications  No current outpatient prescriptions on file.  BP 195/104  Pulse 53  Temp(Src) 97.9 F (36.6 C) (Oral)  Resp 20  Ht 6\' 3"  (1.905 m)  Wt 240 lb (108.863 kg)  BMI 30 kg/m2  SpO2 99%  Physical Exam  Nursing note and vitals reviewed. Constitutional: He is  oriented to person, place, and time. He appears well-developed and well-nourished.  Uncomfortable appearing male who is hypertensive.  HENT:  Head: Normocephalic and atraumatic.  Right Ear: External ear normal.  Left Ear: External ear normal.  Nose: Nose normal.  Mouth/Throat: Oropharynx is clear and moist.  Eyes: Conjunctivae and EOM are normal. Pupils are equal, round, and reactive to light.  Neck: Normal range of motion. Neck supple.  Cardiovascular: Normal rate, regular rhythm, normal heart sounds and intact distal pulses.   Pulmonary/Chest: Effort normal and breath sounds normal. No respiratory distress. He has no wheezes. He exhibits no tenderness.  Abdominal: Soft. Bowel sounds are normal. He exhibits no distension and no mass. There is no tenderness. There is no guarding.  Genitourinary: Penis normal.  No swelling or tenderness in testicles the  Musculoskeletal: Normal range of motion.  Neurological: He is alert and oriented to person, place, and time. He has normal reflexes. He exhibits normal muscle tone. Coordination normal.  Skin: Skin is warm and dry.  Psychiatric: He has a normal mood and affect. His behavior is normal. Judgment and thought content normal.    ED Course  Procedures (including critical care time)  Labs Reviewed  CBC WITH DIFFERENTIAL - Abnormal; Notable for the following:    Platelets 135 (*)    All other components within normal limits  COMPREHENSIVE METABOLIC PANEL -  Abnormal; Notable for the following:    Potassium 3.1 (*)    Glucose, Bld 144 (*)    GFR calc non Af Amer 68 (*)    GFR calc Af Amer 79 (*)    All other components within normal limits  URINALYSIS, ROUTINE W REFLEX MICROSCOPIC   Ct Abdomen Pelvis Wo Contrast  07/13/2012  *RADIOLOGY REPORT*  Clinical Data: Right flank pain.  Right lower quadrant pain. Nausea, vomiting, diarrhea.  CT ABDOMEN AND PELVIS WITHOUT CONTRAST  Technique:  Multidetector CT imaging of the abdomen and pelvis was  performed following the standard protocol without intravenous contrast.  Comparison: 09/29/2009.  Findings: Lung Bases: Dependent atelectasis.  Liver:  Unenhanced CT was performed per clinician order.  Lack of IV contrast limits sensitivity and specificity, especially for evaluation of abdominal/pelvic solid viscera.  Tiny low density branching regions in the anterior left hepatic lobe (image #12, image number 18).  These tiny areas appear represent fluid attenuation or fat attenuation rather than gas and the significance is unclear.  These areas were not present on the prior exam of 09/29/2009.  Correlation with liver function tests and bilirubin recommended.  No portal venous gas is identified.  Spleen:  Normal.  Gallbladder:  Normal.  No calcified stones.  Common bile duct:  Normal.  Pancreas:  Normal.  Adrenal glands:  Normal.  Kidneys:  No hydronephrosis.  No calculi.  Duplicated left renal collecting system.  Left ureter appears normal.  Right ureter appears normal.  Stomach:  Grossly normal.  Small bowel:  No small bowel dilation.  No inflammatory changes. No mesenteric adenopathy.  Colon:   Normal appendix.  No inflammatory changes of colon.  No dilation or air-fluid levels.  Pelvic Genitourinary:  The urinary bladder appears normal.  No free fluid.  Bones:  No aggressive osseous lesions.  Vasculature: Minimal atherosclerosis.  No acute vascular abnormality.  IMPRESSION:  1.  Negative for urolithiasis. 2.  Two tiny foci of low attenuation in the left hepatic lobe are nonspecific.  Correlation with liver function tests and bilirubin recommended.   Original Report Authenticated By: Andreas Newport, M.D.      No diagnosis found.    MDM  Patient is symptomatically resolved. He has no obvious source of the original pain. He is advised regarding the low density your areas of the left hepatic lobe. He is advised close followup. He is given return precautions. He is noted to be very hypertensive here. His  not taking antihypertensive medicines. He is advised close followup with his primary care Dr.        Hilario Quarry, MD 07/13/12 [phone]

## 2012-07-13 NOTE — ED Notes (Signed)
Pt states that he had onset of abd pain this morning at 0530, c/o cramping, n/v/d, states that he believes he got food poisoning.  No urinary s/sx.

## 2013-01-29 NOTE — Op Note (Signed)
agree

## 2013-05-20 ENCOUNTER — Encounter (HOSPITAL_COMMUNITY): Payer: Self-pay | Admitting: Emergency Medicine

## 2013-05-20 ENCOUNTER — Emergency Department (HOSPITAL_COMMUNITY)
Admission: EM | Admit: 2013-05-20 | Discharge: 2013-05-21 | Disposition: A | Discharge status: Home / Self Care | Payer: PRIVATE HEALTH INSURANCE | Attending: Emergency Medicine | Admitting: Emergency Medicine

## 2013-05-20 ENCOUNTER — Emergency Department (HOSPITAL_COMMUNITY): Payer: PRIVATE HEALTH INSURANCE

## 2013-05-20 DIAGNOSIS — F17200 Nicotine dependence, unspecified, uncomplicated: Secondary | ICD-10-CM | POA: Insufficient documentation

## 2013-05-20 DIAGNOSIS — I10 Essential (primary) hypertension: Secondary | ICD-10-CM | POA: Insufficient documentation

## 2013-05-20 DIAGNOSIS — Z8701 Personal history of pneumonia (recurrent): Secondary | ICD-10-CM | POA: Insufficient documentation

## 2013-05-20 DIAGNOSIS — Z86711 Personal history of pulmonary embolism: Secondary | ICD-10-CM | POA: Insufficient documentation

## 2013-05-20 DIAGNOSIS — M545 Low back pain, unspecified: Secondary | ICD-10-CM

## 2013-05-20 DIAGNOSIS — R0602 Shortness of breath: Secondary | ICD-10-CM | POA: Insufficient documentation

## 2013-05-20 DIAGNOSIS — IMO0002 Reserved for concepts with insufficient information to code with codable children: Secondary | ICD-10-CM | POA: Insufficient documentation

## 2013-05-20 DIAGNOSIS — Y9389 Activity, other specified: Secondary | ICD-10-CM | POA: Insufficient documentation

## 2013-05-20 DIAGNOSIS — Y929 Unspecified place or not applicable: Secondary | ICD-10-CM | POA: Insufficient documentation

## 2013-05-20 DIAGNOSIS — Z8709 Personal history of other diseases of the respiratory system: Secondary | ICD-10-CM | POA: Insufficient documentation

## 2013-05-20 DIAGNOSIS — X500XXA Overexertion from strenuous movement or load, initial encounter: Secondary | ICD-10-CM | POA: Insufficient documentation

## 2013-05-20 MED ORDER — KETOROLAC TROMETHAMINE 60 MG/2ML IM SOLN
60.0000 mg | Freq: Once | INTRAMUSCULAR | Status: AC
  Administered 2013-05-20: 60 mg via INTRAMUSCULAR
  Filled 2013-05-20: qty 2

## 2013-05-20 MED ORDER — OXYCODONE-ACETAMINOPHEN 5-325 MG PO TABS: 2.0000 | ORAL_TABLET | Freq: Once | ORAL | Status: DC

## 2013-05-20 MED ORDER — HYDROCODONE-ACETAMINOPHEN 5-325 MG PO TABS
1.0000 | ORAL_TABLET | Freq: Once | ORAL | Status: AC
  Administered 2013-05-20: 1 via ORAL
  Filled 2013-05-20: qty 1

## 2013-05-20 MED ORDER — OXYCODONE-ACETAMINOPHEN 5-325 MG PO TABS
1.0000 | ORAL_TABLET | Freq: Once | ORAL | Status: AC
  Administered 2013-05-20: 1 via ORAL
  Filled 2013-05-20: qty 1

## 2013-05-20 NOTE — ED Notes (Signed)
Pt. reports right lower back pain after lifting heavy sleeper chair this evening .

## 2013-05-20 NOTE — ED Provider Notes (Signed)
CSN: [id number]     Arrival date & time 05/20/13  2059 History   First MD Initiated Contact with Patient 05/20/13 2133 This chart was scribed for non-physician practitioner Eliezer Bottomobert Gray Sokhna Christoph, PA-C working with Darlys Galesavid Masneri, MD by Valera CastleSteven Perry, ED scribe. This patient was seen in room TR07C/TR07C and the patient's care was started at 11:18 PM.     Chief Complaint  Patient presents with  . Back Pain   (Consider location/radiation/quality/duration/timing/severity/associated sxs/prior Treatment) The history is provided by the patient. No language interpreter was used.   HPI Comments: Alec Grant is a 38 y.o. male who presents to the Emergency Department complaining of sharp, constant, 8/10, right, lower back pain. onset earlier this evening immediately after lifting up heavy sleeper chair. He denies taking any pain medication at home. He reports that movement, bending over exacerbates his pain. He denies weakness, numbness, urinary symptoms, fever, chills, and any other associated symptoms. He reports h/o smoking. He had PE 2007, stating it started with a LE DVT. He denies h/o kidney stones, cancer, IV drug use. He denies having PCP. Denies any CP, SOB, or hemoptysis.   PCP - Default, Provider, MD  Past Medical History  Diagnosis Date  . Chronic bronchitis     pt doesnt remember date  . Pneumonia   . Hypertension   . Pulmonary embolism   . Migraines    History reviewed. No pertinent past surgical history. Family History  Problem Relation Age of Onset  . Stroke Mother   . Diabetes type II Mother    History  Substance Use Topics  . Smoking status: Current Every Day Smoker -- 1.50 packs/day for 15 years    Types: Cigarettes  . Smokeless tobacco: Never Used  . Alcohol Use: Yes     Comment: Occassional Use    Review of Systems  Respiratory: Positive for shortness of breath.   Cardiovascular: Negative for chest pain and leg swelling.  Gastrointestinal: Negative for abdominal  pain.  All other systems reviewed and are negative.   Allergies  Review of patient's allergies indicates no known allergies.  Home Medications   Current Outpatient Rx  Name  Route  Sig  Dispense  Refill  . methocarbamol (ROBAXIN) 500 MG tablet   Oral   Take 1 tablet (500 mg total) by mouth 2 (two) times daily.   20 tablet   0   . naproxen (NAPROSYN) 500 MG tablet   Oral   Take 1 tablet (500 mg total) by mouth 2 (two) times daily.   30 tablet   0   . oxyCODONE-acetaminophen (PERCOCET) 5-325 MG per tablet   Oral   Take 1-2 tablets by mouth every 4 (four) hours as needed.   10 tablet   0     BP 174/98  Pulse 66  Temp(Src) 98.3 F (36.8 C) (Oral)  Resp 16  Wt 235 lb (106.595 kg)  SpO2 96%  Physical Exam  Nursing note and vitals reviewed. Constitutional: He is oriented to person, place, and time. He appears well-developed and well-nourished. No distress.  HENT:  Head: Normocephalic and atraumatic.  Eyes: Conjunctivae are normal.  Neck: No JVD present. No tracheal deviation present.  Cardiovascular: Normal rate and regular rhythm.  Exam reveals no gallop and no friction rub.   No murmur heard. Pulmonary/Chest: Effort normal. No respiratory distress. He has no wheezes. He has no rhonchi. He has no rales.  Musculoskeletal: Normal range of motion. He exhibits no edema.  Right lumbar paraspinal  tenderness with notable spasm. No midline spinal tenderness.   Neurological: He is alert and oriented to person, place, and time.  Strength normal bilateral LE.  Skin: Skin is warm and dry. He is not diaphoretic.  Psychiatric: He has a normal mood and affect. His behavior is normal.    ED Course  Procedures (including critical care time)  DIAGNOSTIC STUDIES: Oxygen Saturation is 96% on room air, normal by my interpretation.    COORDINATION OF CARE: 11:26 PM-Discussed negative imaging results with pt. Discussed treatment plan which includes pain medication with pt at  bedside and pt agreed to plan.  Dg Lumbar Spine Complete  05/20/2013   CLINICAL DATA:  Right low back pain, lifting injury  EXAM: LUMBAR SPINE - COMPLETE 4+ VIEW  COMPARISON:  07/13/2012  FINDINGS: There is no evidence of lumbar spine fracture. Alignment is normal. Intervertebral disc spaces are maintained.  IMPRESSION: No acute osseous finding   Electronically Signed   By: Ruel Favors M.D.   On: 05/20/2013 21:40    EKG Interpretation None      MDM   Final diagnoses:  Low back pain   Patient hypertensive on admission 174/98. Patient with hx of HTN. Treated with Lisinopril. Hx of elevated BP at previous visits on 07/13/12 (239/108) and 09/16/11 (184/101). Plain films of Lumbar spine show no acute bony abnormalities.  Pain appears muscular in nature.  Pain markedly improved with tx in ED.  Plan to start patient on NSAIDs, and muscle relaxants. Discussed findings with patient. Return precautions given for any worsening back pain, leg weakness/numbness, urinary difficulties, Chest pain, or Shortness of breath. Recommend follow up with PCP for further management of BP. Patient agrees with plan. Discharged in good condition.   Meds given in ED:  Medications  ketorolac (TORADOL) injection 60 mg (60 mg Intramuscular Given 05/20/13 2204)  HYDROcodone-acetaminophen (NORCO/VICODIN) 5-325 MG per tablet 1 tablet (1 tablet Oral Given 05/20/13 2204)  oxyCODONE-acetaminophen (PERCOCET/ROXICET) 5-325 MG per tablet 1 tablet (1 tablet Oral Given 05/20/13 2331)    Discharge Medication List as of 05/21/2013 12:05 AM    START taking these medications   Details  methocarbamol (ROBAXIN) 500 MG tablet Take 1 tablet (500 mg total) by mouth 2 (two) times daily., Starting 05/21/2013, Until Discontinued, Print    naproxen (NAPROSYN) 500 MG tablet Take 1 tablet (500 mg total) by mouth 2 (two) times daily., Starting 05/21/2013, Until Discontinued, Print    oxyCODONE-acetaminophen (PERCOCET) 5-325 MG per tablet Take  1-2 tablets by mouth every 4 (four) hours as needed., Starting 05/21/2013, Until Discontinued, Print       I personally performed the services described in this documentation, which was scribed in my presence. The recorded information has been reviewed and is accurate.   Rudene Anda, PA-C 05/23/13 1102

## 2013-05-20 NOTE — ED Notes (Signed)
Patient transported to X-ray and return 

## 2013-05-21 MED ORDER — OXYCODONE-ACETAMINOPHEN 5-325 MG PO TABS: 1.0000 | ORAL_TABLET | ORAL | Status: DC | PRN

## 2013-05-21 MED ORDER — METHOCARBAMOL 500 MG PO TABS: 500.0000 mg | ORAL_TABLET | Freq: Two times a day (BID) | ORAL | Status: DC

## 2013-05-21 MED ORDER — NAPROXEN 500 MG PO TABS: 500.0000 mg | ORAL_TABLET | Freq: Two times a day (BID) | ORAL | Status: DC

## 2013-05-21 NOTE — ED Notes (Signed)
Ambulates without distress

## 2013-05-21 NOTE — Discharge Instructions (Signed)
Take medications as directed. Return to Emergency department if symptoms worsen or do not improve with treatment. Do not drive with prescription pain medications.    Back Pain, Adult Back pain is very common. The pain often gets better over time. The cause of back pain is usually not dangerous. Most people can learn to manage their back pain on their own.  HOME CARE   Stay active. Start with short walks on flat ground if you can. Try to walk farther each day.  Do not sit, drive, or stand in one place for more than 30 minutes. Do not stay in bed.  Do not avoid exercise or work. Activity can help your back heal faster.  Be careful when you bend or lift an object. Bend at your knees, keep the object close to you, and do not twist.  Sleep on a firm mattress. Lie on your side, and bend your knees. If you lie on your back, put a pillow under your knees.  Only take medicines as told by your doctor.  Put ice on the injured area.  Put ice in a plastic bag.  Place a towel between your skin and the bag.  Leave the ice on for 15-20 minutes, 03-04 times a day for the first 2 to 3 days. After that, you can switch between ice and heat packs.  Ask your doctor about back exercises or massage.  Avoid feeling anxious or stressed. Find good ways to deal with stress, such as exercise. GET HELP RIGHT AWAY IF:   Your pain does not go away with rest or medicine.  Your pain does not go away in 1 week.  You have new problems.  You do not feel well.  The pain spreads into your legs.  You cannot control when you poop (bowel movement) or pee (urinate).  Your arms or legs feel weak or lose feeling (numbness).  You feel sick to your stomach (nauseous) or throw up (vomit).  You have belly (abdominal) pain.  You feel like you may pass out (faint). MAKE SURE YOU:   Understand these instructions.  Will watch your condition.  Will get help right away if you are not doing well or get  worse. Document Released: 08/14/2007 Document Revised: 05/20/2011 Document Reviewed: 07/16/2010 [facility] Patient Information 2014 [employer], [place].

## 2013-05-24 NOTE — ED Provider Notes (Signed)
Medical screening examination/treatment/procedure(s) were performed by non-physician practitioner and as supervising physician I was immediately available for consultation/collaboration.   EKG Interpretation None        Thessaly Mccullers, MD 05/24/13 1838 

## 2013-11-25 ENCOUNTER — Emergency Department (HOSPITAL_COMMUNITY)
Admission: EM | Admit: 2013-11-25 | Discharge: 2013-11-25 | Disposition: A | Discharge status: Home / Self Care | Payer: PRIVATE HEALTH INSURANCE | Attending: Emergency Medicine | Admitting: Emergency Medicine

## 2013-11-25 ENCOUNTER — Encounter (HOSPITAL_COMMUNITY): Payer: Self-pay | Admitting: Emergency Medicine

## 2013-11-25 DIAGNOSIS — R059 Cough, unspecified: Secondary | ICD-10-CM | POA: Insufficient documentation

## 2013-11-25 DIAGNOSIS — Z8701 Personal history of pneumonia (recurrent): Secondary | ICD-10-CM | POA: Insufficient documentation

## 2013-11-25 DIAGNOSIS — Y92410 Unspecified street and highway as the place of occurrence of the external cause: Secondary | ICD-10-CM | POA: Insufficient documentation

## 2013-11-25 DIAGNOSIS — IMO0002 Reserved for concepts with insufficient information to code with codable children: Secondary | ICD-10-CM | POA: Insufficient documentation

## 2013-11-25 DIAGNOSIS — Z86711 Personal history of pulmonary embolism: Secondary | ICD-10-CM | POA: Insufficient documentation

## 2013-11-25 DIAGNOSIS — M546 Pain in thoracic spine: Secondary | ICD-10-CM

## 2013-11-25 DIAGNOSIS — Y9389 Activity, other specified: Secondary | ICD-10-CM | POA: Insufficient documentation

## 2013-11-25 DIAGNOSIS — I10 Essential (primary) hypertension: Secondary | ICD-10-CM

## 2013-11-25 DIAGNOSIS — R1110 Vomiting, unspecified: Secondary | ICD-10-CM | POA: Insufficient documentation

## 2013-11-25 DIAGNOSIS — R05 Cough: Secondary | ICD-10-CM | POA: Insufficient documentation

## 2013-11-25 DIAGNOSIS — F17200 Nicotine dependence, unspecified, uncomplicated: Secondary | ICD-10-CM | POA: Insufficient documentation

## 2013-11-25 DIAGNOSIS — Z8709 Personal history of other diseases of the respiratory system: Secondary | ICD-10-CM | POA: Insufficient documentation

## 2013-11-25 LAB — I-STAT CHEM 8, ED
BUN: 9 mg/dL (ref 6–23)
Calcium, Ion: 1.18 mmol/L (ref 1.12–1.23)
Chloride: 103 mEq/L (ref 96–112)
Creatinine, Ser: 1.3 mg/dL (ref 0.50–1.35)
GLUCOSE: 90 mg/dL (ref 70–99)
HEMATOCRIT: 45 % (ref 39.0–52.0)
HEMOGLOBIN: 15.3 g/dL (ref 13.0–17.0)
Potassium: 4 mEq/L (ref 3.7–5.3)
Sodium: 139 mEq/L (ref 137–147)
TCO2: 26 mmol/L (ref 0–100)

## 2013-11-25 MED ORDER — HYDROCHLOROTHIAZIDE 25 MG PO TABS: 25.0000 mg | ORAL_TABLET | Freq: Every day | ORAL | Status: DC

## 2013-11-25 MED ORDER — OXYCODONE-ACETAMINOPHEN 5-325 MG PO TABS
2.0000 | ORAL_TABLET | Freq: Once | ORAL | Status: AC
  Administered 2013-11-25: 2 via ORAL
  Filled 2013-11-25: qty 2

## 2013-11-25 MED ORDER — METHOCARBAMOL 750 MG PO TABS: 750.0000 mg | ORAL_TABLET | Freq: Four times a day (QID) | ORAL | Status: DC | PRN

## 2013-11-25 NOTE — ED Notes (Signed)
PA notified of BP 

## 2013-11-25 NOTE — ED Provider Notes (Signed)
Medical screening examination/treatment/procedure(s) were performed by non-physician practitioner and as supervising physician I was immediately available for consultation/collaboration.   EKG Interpretation None        Layla Maw Jayzon Taras, DO 11/25/13 2315
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
## 2013-11-25 NOTE — ED Notes (Signed)
PA at bedside.
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
## 2013-11-25 NOTE — ED Notes (Signed)
BP measurement recorded to PA

## 2013-12-10 ENCOUNTER — Emergency Department (HOSPITAL_COMMUNITY)
Admission: EM | Admit: 2013-12-10 | Discharge: 2013-12-10 | Disposition: A | Discharge status: Home / Self Care | Payer: No Typology Code available for payment source | Attending: Emergency Medicine | Admitting: Emergency Medicine

## 2013-12-10 ENCOUNTER — Emergency Department (HOSPITAL_COMMUNITY): Payer: No Typology Code available for payment source

## 2013-12-10 ENCOUNTER — Encounter (HOSPITAL_COMMUNITY): Payer: Self-pay | Admitting: Emergency Medicine

## 2013-12-10 DIAGNOSIS — S3992XA Unspecified injury of lower back, initial encounter: Secondary | ICD-10-CM | POA: Diagnosis present

## 2013-12-10 DIAGNOSIS — Z8709 Personal history of other diseases of the respiratory system: Secondary | ICD-10-CM | POA: Diagnosis not present

## 2013-12-10 DIAGNOSIS — S3991XA Unspecified injury of abdomen, initial encounter: Secondary | ICD-10-CM | POA: Insufficient documentation

## 2013-12-10 DIAGNOSIS — Z8701 Personal history of pneumonia (recurrent): Secondary | ICD-10-CM | POA: Insufficient documentation

## 2013-12-10 DIAGNOSIS — Z86711 Personal history of pulmonary embolism: Secondary | ICD-10-CM | POA: Insufficient documentation

## 2013-12-10 DIAGNOSIS — M546 Pain in thoracic spine: Secondary | ICD-10-CM

## 2013-12-10 DIAGNOSIS — Y9389 Activity, other specified: Secondary | ICD-10-CM | POA: Insufficient documentation

## 2013-12-10 DIAGNOSIS — S8992XA Unspecified injury of left lower leg, initial encounter: Secondary | ICD-10-CM | POA: Insufficient documentation

## 2013-12-10 DIAGNOSIS — I10 Essential (primary) hypertension: Secondary | ICD-10-CM | POA: Diagnosis not present

## 2013-12-10 DIAGNOSIS — S299XXA Unspecified injury of thorax, initial encounter: Secondary | ICD-10-CM | POA: Diagnosis not present

## 2013-12-10 DIAGNOSIS — Z720 Tobacco use: Secondary | ICD-10-CM | POA: Diagnosis not present

## 2013-12-10 DIAGNOSIS — Y92410 Unspecified street and highway as the place of occurrence of the external cause: Secondary | ICD-10-CM | POA: Diagnosis not present

## 2013-12-10 LAB — COMPREHENSIVE METABOLIC PANEL
ALT: 27 U/L (ref 0–53)
AST: 34 U/L (ref 0–37)
Albumin: 4.2 g/dL (ref 3.5–5.2)
Alkaline Phosphatase: 70 U/L (ref 39–117)
Anion gap: 13 (ref 5–15)
BILIRUBIN TOTAL: 0.6 mg/dL (ref 0.3–1.2)
BUN: 11 mg/dL (ref 6–23)
CHLORIDE: 100 meq/L (ref 96–112)
CO2: 26 mEq/L (ref 19–32)
CREATININE: 1.39 mg/dL — AB (ref 0.50–1.35)
Calcium: 9.4 mg/dL (ref 8.4–10.5)
GFR calc Af Amer: 73 mL/min — ABNORMAL LOW (ref >90)
GFR calc non Af Amer: 63 mL/min — ABNORMAL LOW (ref >90)
Glucose, Bld: 85 mg/dL (ref 70–99)
Potassium: 4.1 mEq/L (ref 3.7–5.3)
Sodium: 139 mEq/L (ref 137–147)
Total Protein: 7.5 g/dL (ref 6.0–8.3)

## 2013-12-10 LAB — CBC WITH DIFFERENTIAL/PLATELET
BASOS ABS: 0 10*3/uL (ref 0.0–0.1)
BASOS PCT: 1 % (ref 0–1)
Eosinophils Absolute: 0.1 10*3/uL (ref 0.0–0.7)
Eosinophils Relative: 2 % (ref 0–5)
HEMATOCRIT: 42.6 % (ref 39.0–52.0)
Hemoglobin: 14.8 g/dL (ref 13.0–17.0)
Lymphocytes Relative: 44 % (ref 12–46)
Lymphs Abs: 2.6 10*3/uL (ref 0.7–4.0)
MCH: 30.2 pg (ref 26.0–34.0)
MCHC: 34.7 g/dL (ref 30.0–36.0)
MCV: 86.9 fL (ref 78.0–100.0)
MONO ABS: 0.6 10*3/uL (ref 0.1–1.0)
Monocytes Relative: 11 % (ref 3–12)
NEUTROS ABS: 2.4 10*3/uL (ref 1.7–7.7)
NEUTROS PCT: 42 % — AB (ref 43–77)
Platelets: 145 10*3/uL — ABNORMAL LOW (ref 150–400)
RBC: 4.9 MIL/uL (ref 4.22–5.81)
RDW: 12.7 % (ref 11.5–15.5)
WBC: 5.8 10*3/uL (ref 4.0–10.5)

## 2013-12-10 MED ORDER — FENTANYL CITRATE 0.05 MG/ML IJ SOLN
50.0000 ug | Freq: Once | INTRAMUSCULAR | Status: AC
  Administered 2013-12-10: 50 ug via INTRAVENOUS
  Filled 2013-12-10: qty 2
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
## 2013-12-10 NOTE — ED Notes (Signed)
Dr. Margreta JourneyGunalda at bedside, c-collar removed

## 2013-12-10 NOTE — ED Notes (Signed)
Pt removed from backboard by resident.
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
## 2013-12-15 ENCOUNTER — Emergency Department (INDEPENDENT_AMBULATORY_CARE_PROVIDER_SITE_OTHER)
Admission: EM | Admit: 2013-12-15 | Discharge: 2013-12-15 | Disposition: A | Discharge status: Home / Self Care | Payer: Self-pay | Source: Home / Self Care | Attending: Family Medicine | Admitting: Family Medicine

## 2013-12-15 ENCOUNTER — Encounter (HOSPITAL_COMMUNITY): Payer: Self-pay | Admitting: Emergency Medicine

## 2013-12-15 DIAGNOSIS — I10 Essential (primary) hypertension: Secondary | ICD-10-CM

## 2013-12-15 DIAGNOSIS — M25512 Pain in left shoulder: Secondary | ICD-10-CM

## 2013-12-15 DIAGNOSIS — Y92811 Bus as the place of occurrence of the external cause: Secondary | ICD-10-CM

## 2013-12-15 MED ORDER — HYDROCODONE-ACETAMINOPHEN 5-325 MG PO TABS: 1.0000 | ORAL_TABLET | Freq: Four times a day (QID) | ORAL | Status: DC | PRN

## 2013-12-15 MED ORDER — HYDROCHLOROTHIAZIDE 25 MG PO TABS: 25.0000 mg | ORAL_TABLET | Freq: Every day | ORAL | Status: DC

## 2013-12-15 MED ORDER — CYCLOBENZAPRINE HCL 10 MG PO TABS: 10.0000 mg | ORAL_TABLET | Freq: Every evening | ORAL | Status: DC | PRN
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
## 2013-12-15 NOTE — Discharge Instructions (Signed)
Thank you for coming in today. Follow up with Physical therapy.  Follow up with Dr. Farris HasKramer. Come back or go to the emergency room if you notice new weakness new numbness problems walking or bowel or bladder problems.  Cervical Strain and Sprain (Whiplash) with Rehab Cervical strain and sprain are injuries that commonly occur with "whiplash" injuries. Whiplash occurs when the neck is forcefully whipped backward or forward, such as during a motor vehicle accident or during contact sports. The muscles, ligaments, tendons, discs, and nerves of the neck are susceptible to injury when this occurs. RISK FACTORS Risk of having a whiplash injury increases if:  Osteoarthritis of the spine.  Situations that make head or neck accidents or trauma more likely.  High-risk sports (football, rugby, wrestling, hockey, auto racing, gymnastics, diving, contact karate, or boxing).  Poor strength and flexibility of the neck.  Previous neck injury.  Poor tackling technique.  Improperly fitted or padded equipment. SYMPTOMS   Pain or stiffness in the front or back of neck or both.  Symptoms may present immediately or up to 24 hours after injury.  Dizziness, headache, nausea, and vomiting.  Muscle spasm with soreness and stiffness in the neck.  Tenderness and swelling at the injury site. PREVENTION  Learn and use proper technique (avoid tackling with the head, spearing, and head-butting; use proper falling techniques to avoid landing on the head).  Warm up and stretch properly before activity.  Maintain physical fitness:  Strength, flexibility, and endurance.  Cardiovascular fitness.  Wear properly fitted and padded protective equipment, such as padded soft collars, for participation in contact sports. PROGNOSIS  Recovery from cervical strain and sprain injuries is dependent on the extent of the injury. These injuries are usually curable in 1 week to 3 months with appropriate treatment.    RELATED COMPLICATIONS   Temporary numbness and weakness may occur if the nerve roots are damaged, and this may persist until the nerve has completely healed.  Chronic pain due to frequent recurrence of symptoms.  Prolonged healing, especially if activity is resumed too soon (before complete recovery). TREATMENT  Treatment initially involves the use of ice and medication to help reduce pain and inflammation. It is also important to perform strengthening and stretching exercises and modify activities that worsen symptoms so the injury does not get worse. These exercises may be performed at home or with a therapist. For patients who experience severe symptoms, a soft, padded collar may be recommended to be worn around the neck.  Improving your posture may help reduce symptoms. Posture improvement includes pulling your chin and abdomen in while sitting or standing. If you are sitting, sit in a firm chair with your buttocks against the back of the chair. While sleeping, try replacing your pillow with a small towel rolled to 2 inches in diameter, or use a cervical pillow or soft cervical collar. Poor sleeping positions delay healing.  For patients with nerve root damage, which causes numbness or weakness, the use of a cervical traction apparatus may be recommended. Surgery is rarely necessary for these injuries. However, cervical strain and sprains that are present at birth (congenital) may require surgery. MEDICATION   If pain medication is necessary, nonsteroidal anti-inflammatory medications, such as aspirin and ibuprofen, or other minor pain relievers, such as acetaminophen, are often recommended.  Do not take pain medication for 7 days before surgery.  Prescription pain relievers may be given if deemed necessary by your caregiver. Use only as directed and only as much as  you need. HEAT AND COLD:   Cold treatment (icing) relieves pain and reduces inflammation. Cold treatment should be applied for  10 to 15 minutes every 2 to 3 hours for inflammation and pain and immediately after any activity that aggravates your symptoms. Use ice packs or an ice massage.  Heat treatment may be used prior to performing the stretching and strengthening activities prescribed by your caregiver, physical therapist, or athletic trainer. Use a heat pack or a warm soak. SEEK MEDICAL CARE IF:   Symptoms get worse or do not improve in 2 weeks despite treatment.  New, unexplained symptoms develop (drugs used in treatment may produce side effects). EXERCISES RANGE OF MOTION (ROM) AND STRETCHING EXERCISES - Cervical Strain and Sprain These exercises may help you when beginning to rehabilitate your injury. In order to successfully resolve your symptoms, you must improve your posture. These exercises are designed to help reduce the forward-head and rounded-shoulder posture which contributes to this condition. Your symptoms may resolve with or without further involvement from your physician, physical therapist or athletic trainer. While completing these exercises, remember:   Restoring tissue flexibility helps normal motion to return to the joints. This allows healthier, less painful movement and activity.  An effective stretch should be held for at least 20 seconds, although you may need to begin with shorter hold times for comfort.  A stretch should never be painful. You should only feel a gentle lengthening or release in the stretched tissue. STRETCH- Axial Extensors  Lie on your back on the floor. You may bend your knees for comfort. Place a rolled-up hand towel or dish towel, about 2 inches in diameter, under the part of your head that makes contact with the floor.  Gently tuck your chin, as if trying to make a "double chin," until you feel a gentle stretch at the base of your head.  Hold __________ seconds. Repeat __________ times. Complete this exercise __________ times per day.  STRETCH - Axial Extension    Stand or sit on a firm surface. Assume a good posture: chest up, shoulders drawn back, abdominal muscles slightly tense, knees unlocked (if standing) and feet hip width apart.  Slowly retract your chin so your head slides back and your chin slightly lowers. Continue to look straight ahead.  You should feel a gentle stretch in the back of your head. Be certain not to feel an aggressive stretch since this can cause headaches later.  Hold for __________ seconds. Repeat __________ times. Complete this exercise __________ times per day. STRETCH - Cervical Side Bend   Stand or sit on a firm surface. Assume a good posture: chest up, shoulders drawn back, abdominal muscles slightly tense, knees unlocked (if standing) and feet hip width apart.  Without letting your nose or shoulders move, slowly tip your right / left ear to your shoulder until your feel a gentle stretch in the muscles on the opposite side of your neck.  Hold __________ seconds. Repeat __________ times. Complete this exercise __________ times per day. STRETCH - Cervical Rotators   Stand or sit on a firm surface. Assume a good posture: chest up, shoulders drawn back, abdominal muscles slightly tense, knees unlocked (if standing) and feet hip width apart.  Keeping your eyes level with the ground, slowly turn your head until you feel a gentle stretch along the back and opposite side of your neck.  Hold __________ seconds. Repeat __________ times. Complete this exercise __________ times per day. RANGE OF MOTION - Neck Circles  Stand or sit on a firm surface. Assume a good posture: chest up, shoulders drawn back, abdominal muscles slightly tense, knees unlocked (if standing) and feet hip width apart.  Gently roll your head down and around from the back of one shoulder to the back of the other. The motion should never be forced or painful.  Repeat the motion 10-20 times, or until you feel the neck muscles relax and loosen. Repeat  __________ times. Complete the exercise __________ times per day. STRENGTHENING EXERCISES - Cervical Strain and Sprain These exercises may help you when beginning to rehabilitate your injury. They may resolve your symptoms with or without further involvement from your physician, physical therapist, or athletic trainer. While completing these exercises, remember:   Muscles can gain both the endurance and the strength needed for everyday activities through controlled exercises.  Complete these exercises as instructed by your physician, physical therapist, or athletic trainer. Progress the resistance and repetitions only as guided.  You may experience muscle soreness or fatigue, but the pain or discomfort you are trying to eliminate should never worsen during these exercises. If this pain does worsen, stop and make certain you are following the directions exactly. If the pain is still present after adjustments, discontinue the exercise until you can discuss the trouble with your clinician. STRENGTH - Cervical Flexors, Isometric  Face a wall, standing about 6 inches away. Place a small pillow, a ball about 6-8 inches in diameter, or a folded towel between your forehead and the wall.  Slightly tuck your chin and gently push your forehead into the soft object. Push only with mild to moderate intensity, building up tension gradually. Keep your jaw and forehead relaxed.  Hold 10 to 20 seconds. Keep your breathing relaxed.  Release the tension slowly. Relax your neck muscles completely before you start the next repetition. Repeat __________ times. Complete this exercise __________ times per day. STRENGTH- Cervical Lateral Flexors, Isometric   Stand about 6 inches away from a wall. Place a small pillow, a ball about 6-8 inches in diameter, or a folded towel between the side of your head and the wall.  Slightly tuck your chin and gently tilt your head into the soft object. Push only with mild to moderate  intensity, building up tension gradually. Keep your jaw and forehead relaxed.  Hold 10 to 20 seconds. Keep your breathing relaxed.  Release the tension slowly. Relax your neck muscles completely before you start the next repetition. Repeat __________ times. Complete this exercise __________ times per day. STRENGTH - Cervical Extensors, Isometric   Stand about 6 inches away from a wall. Place a small pillow, a ball about 6-8 inches in diameter, or a folded towel between the back of your head and the wall.  Slightly tuck your chin and gently tilt your head back into the soft object. Push only with mild to moderate intensity, building up tension gradually. Keep your jaw and forehead relaxed.  Hold 10 to 20 seconds. Keep your breathing relaxed.  Release the tension slowly. Relax your neck muscles completely before you start the next repetition. Repeat __________ times. Complete this exercise __________ times per day. POSTURE AND BODY MECHANICS CONSIDERATIONS - Cervical Strain and Sprain Keeping correct posture when sitting, standing or completing your activities will reduce the stress put on different body tissues, allowing injured tissues a chance to heal and limiting painful experiences. The following are general guidelines for improved posture. Your physician or physical therapist will provide you with any instructions specific  to your needs. While reading these guidelines, remember:  The exercises prescribed by your provider will help you have the flexibility and strength to maintain correct postures.  The correct posture provides the optimal environment for your joints to work. All of your joints have less wear and tear when properly supported by a spine with good posture. This means you will experience a healthier, less painful body.  Correct posture must be practiced with all of your activities, especially prolonged sitting and standing. Correct posture is as important when doing repetitive  low-stress activities (typing) as it is when doing a single heavy-load activity (lifting). PROLONGED STANDING WHILE SLIGHTLY LEANING FORWARD When completing a task that requires you to lean forward while standing in one place for a long time, place either foot up on a stationary 2- to 4-inch high object to help maintain the best posture. When both feet are on the ground, the low back tends to lose its slight inward curve. If this curve flattens (or becomes too large), then the back and your other joints will experience too much stress, fatigue more quickly, and can cause pain.  RESTING POSITIONS Consider which positions are most painful for you when choosing a resting position. If you have pain with flexion-based activities (sitting, bending, stooping, squatting), choose a position that allows you to rest in a less flexed posture. You would want to avoid curling into a fetal position on your side. If your pain worsens with extension-based activities (prolonged standing, working overhead), avoid resting in an extended position such as sleeping on your stomach. Most people will find more comfort when they rest with their spine in a more neutral position, neither too rounded nor too arched. Lying on a non-sagging bed on your side with a pillow between your knees, or on your back with a pillow under your knees will often provide some relief. Keep in mind, being in any one position for a prolonged period of time, no matter how correct your posture, can still lead to stiffness. WALKING Walk with an upright posture. Your ears, shoulders, and hips should all line up. OFFICE WORK When working at a desk, create an environment that supports good, upright posture. Without extra support, muscles fatigue and lead to excessive strain on joints and other tissues. CHAIR:  A chair should be able to slide under your desk when your back makes contact with the back of the chair. This allows you to work closely.  The chair's  height should allow your eyes to be level with the upper part of your monitor and your hands to be slightly lower than your elbows.  Body position:  Your feet should make contact with the floor. If this is not possible, use a foot rest.  Keep your ears over your shoulders. This will reduce stress on your neck and low back. Document Released: 02/25/2005 Document Revised: 07/12/2013 Document Reviewed: 06/09/2008 [facility] Patient Information 2015 [place], [place]. This information is not intended to replace advice given to you by your health care provider. Make sure you discuss any questions you have with your health care provider.

## 2013-12-16 ENCOUNTER — Emergency Department (HOSPITAL_COMMUNITY): Payer: Self-pay

## 2013-12-16 ENCOUNTER — Encounter (HOSPITAL_COMMUNITY): Payer: Self-pay | Admitting: Emergency Medicine

## 2013-12-16 ENCOUNTER — Emergency Department (HOSPITAL_COMMUNITY)
Admission: EM | Admit: 2013-12-16 | Discharge: 2013-12-16 | Disposition: A | Discharge status: Home / Self Care | Payer: Self-pay | Attending: Emergency Medicine | Admitting: Emergency Medicine

## 2013-12-16 DIAGNOSIS — Z79899 Other long term (current) drug therapy: Secondary | ICD-10-CM | POA: Insufficient documentation

## 2013-12-16 DIAGNOSIS — J069 Acute upper respiratory infection, unspecified: Secondary | ICD-10-CM | POA: Insufficient documentation

## 2013-12-16 DIAGNOSIS — Z86711 Personal history of pulmonary embolism: Secondary | ICD-10-CM | POA: Insufficient documentation

## 2013-12-16 DIAGNOSIS — I10 Essential (primary) hypertension: Secondary | ICD-10-CM | POA: Insufficient documentation

## 2013-12-16 DIAGNOSIS — R112 Nausea with vomiting, unspecified: Secondary | ICD-10-CM | POA: Insufficient documentation

## 2013-12-16 DIAGNOSIS — Z8701 Personal history of pneumonia (recurrent): Secondary | ICD-10-CM | POA: Insufficient documentation

## 2013-12-16 DIAGNOSIS — Z720 Tobacco use: Secondary | ICD-10-CM | POA: Insufficient documentation

## 2013-12-16 DIAGNOSIS — G43909 Migraine, unspecified, not intractable, without status migrainosus: Secondary | ICD-10-CM | POA: Insufficient documentation

## 2013-12-16 DIAGNOSIS — R630 Anorexia: Secondary | ICD-10-CM | POA: Insufficient documentation

## 2013-12-16 MED ORDER — MORPHINE SULFATE 4 MG/ML IJ SOLN
4.0000 mg | Freq: Once | INTRAMUSCULAR | Status: AC
  Administered 2013-12-16: 4 mg via INTRAVENOUS
  Filled 2013-12-16: qty 1

## 2013-12-16 MED ORDER — KETOROLAC TROMETHAMINE 30 MG/ML IJ SOLN
30.0000 mg | Freq: Once | INTRAMUSCULAR | Status: AC
  Administered 2013-12-16: 30 mg via INTRAVENOUS
  Filled 2013-12-16: qty 1

## 2013-12-16 MED ORDER — OXYCODONE-ACETAMINOPHEN 5-325 MG PO TABS: 1.0000 | ORAL_TABLET | Freq: Four times a day (QID) | ORAL | Status: DC | PRN

## 2013-12-16 MED ORDER — PROMETHAZINE HCL 25 MG PO TABS: 25.0000 mg | ORAL_TABLET | Freq: Four times a day (QID) | ORAL | Status: DC | PRN

## 2013-12-16 MED ORDER — ONDANSETRON HCL 4 MG/2ML IJ SOLN
4.0000 mg | Freq: Once | INTRAMUSCULAR | Status: AC
Start: 2013-12-16 — End: 2013-12-16
  Administered 2013-12-16: 4 mg via INTRAVENOUS
  Filled 2013-12-16: qty 2

## 2013-12-16 MED ORDER — SODIUM CHLORIDE 0.9 % IV BOLUS (SEPSIS)
1000.0000 mL | Freq: Once | INTRAVENOUS | Status: AC
  Administered 2013-12-16: 1000 mL via INTRAVENOUS

## 2013-12-16 NOTE — ED Provider Notes (Signed)
CSN: [id number]     Arrival date & time 12/16/13  0846 History   First MD Initiated Contact with Patient 12/16/13 0915     No chief complaint on file.    (Consider location/radiation/quality/duration/timing/severity/associated sxs/prior Treatment) The history is provided by the patient.   patient presents with a cough, nausea, vomiting, fevers, myalgias, states began yesterday. States he aches all over. No sore throat. He states his been coughing up green sputum. Fevers up to 101 at home. No diarrhea. No abdominal pain., States he has aches all over. He states him aware that similar symptoms. He is a smoker. He denies drug use  Past Medical History  Diagnosis Date  . Chronic bronchitis     pt doesnt remember date  . Pneumonia   . Hypertension   . Pulmonary embolism   . Migraines    History reviewed. No pertinent past surgical history. Family History  Problem Relation Age of Onset  . Stroke Mother   . Diabetes type II Mother    History  Substance Use Topics  . Smoking status: Current Every Day Smoker -- 1.50 packs/day for 15 years    Types: Cigarettes  . Smokeless tobacco: Never Used  . Alcohol Use: Yes     Comment: Occassional Use    Review of Systems  Constitutional: Positive for fever, chills, appetite change and fatigue. Negative for activity change.  HENT: Negative for ear discharge, ear pain and sore throat.   Eyes: Negative for pain.  Respiratory: Positive for cough and shortness of breath. Negative for chest tightness.   Cardiovascular: Negative for chest pain and leg swelling.  Gastrointestinal: Positive for nausea and vomiting. Negative for abdominal pain and diarrhea.  Genitourinary: Negative for flank pain.  Musculoskeletal: Negative for back pain and neck stiffness.  Skin: Negative for rash.  Neurological: Negative for weakness, numbness and headaches.  Psychiatric/Behavioral: Negative for behavioral problems.      Allergies  Review of patient's  allergies indicates no known allergies.  Home Medications   Prior to Admission medications   Medication Sig Start Date End Date Taking? Authorizing Provider  HYDROcodone-acetaminophen (NORCO/VICODIN) 5-325 MG per tablet Take 1 tablet by mouth every 6 (six) hours as needed for moderate pain.   Yes Historical Provider, MD  cyclobenzaprine (FLEXERIL) 10 MG tablet Take 1 tablet (10 mg total) by mouth at bedtime as needed for muscle spasms. 12/15/13   Rodolph Bong, MD  hydrochlorothiazide (HYDRODIURIL) 25 MG tablet Take 1 tablet (25 mg total) by mouth daily. 12/15/13   Rodolph Bong, MD  oxyCODONE-acetaminophen (PERCOCET/ROXICET) 5-325 MG per tablet Take 1-2 tablets by mouth every 6 (six) hours as needed. 12/16/13   Juliet Rude. Rubin Payor, MD  promethazine (PHENERGAN) 25 MG tablet Take 1 tablet (25 mg total) by mouth every 6 (six) hours as needed for nausea. 12/16/13   Juliet Rude. Bianka Liberati, MD   BP 167/100  Pulse 60  Temp(Src) 98.2 F (36.8 C) (Oral)  Resp 18  Ht 6' 3.5" (1.918 m)  Wt 235 lb (106.595 kg)  BMI 28.98 kg/m2  SpO2 100% Physical Exam  Nursing note and vitals reviewed. Constitutional: He is oriented to person, place, and time. He appears well-developed and well-nourished.  Patient appears uncomfortable  HENT:  Head: Normocephalic and atraumatic.  Eyes: EOM are normal. Pupils are equal, round, and reactive to light.  Neck: Normal range of motion. Neck supple.  Cardiovascular: Normal rate, regular rhythm and normal heart sounds.   No murmur heard. Pulmonary/Chest: Effort normal.  He has wheezes.  Wheezes, that clear with deep inspiration  Abdominal: Soft. Bowel sounds are normal. He exhibits no distension and no mass. There is no tenderness. There is no rebound and no guarding.  Musculoskeletal: Normal range of motion. He exhibits no edema.  Neurological: He is alert and oriented to person, place, and time. No cranial nerve deficit.  Skin: Skin is warm and dry.  Psychiatric: He has a  normal mood and affect.    ED Course  Procedures (including critical care time) Labs Review Labs Reviewed - No data to display  Imaging Review Dg Chest 2 View  12/16/2013   CLINICAL DATA:  Several day history of cough and congestion; fever last night; chest pain today; history of tobacco use  EXAM: CHEST  2 VIEW  COMPARISON:  None.  FINDINGS: The lungs are well-expanded. The interstitial markings are coarse bilaterally and are slightly more conspicuous than on the previous study. There is no alveolar pneumonia. The heart and pulmonary vascularity are within the limits of normal. The mediastinum is normal in width. The bony thorax is unremarkable.  IMPRESSION: Mild hyperinflation may reflect COPD. Increased interstitial markings bilaterally are consistent with the patient's smoking history. One cannot exclude superimposed acute bronchitis in the appropriate clinical setting.   Electronically Signed   By: David  [place]   On: 12/16/2013 09:46     EKG Interpretation None      MDM   Final diagnoses:  URI (upper respiratory infection)    Patient with likely upper respiratory infection. X-ray shows only bronchitis. Feels better after treatment will be discharged home.    Juliet RudeNathan R. Rubin PayorPickering, MD 12/16/13 1623

## 2013-12-16 NOTE — ED Notes (Signed)
Patient transported to X-ray 

## 2013-12-16 NOTE — ED Notes (Signed)
Patient returned from X-ray 

## 2013-12-16 NOTE — ED Notes (Signed)
Pt presents with sudden onset of nausea, vomiting and body aches since yesterday.  Pt reports co-worker has same, also reports 2 day h/o productive cough with green phlegm.

## 2013-12-16 NOTE — Discharge Instructions (Signed)
Upper Respiratory Infection, Adult An upper respiratory infection (URI) is also sometimes known as the common cold. The upper respiratory tract includes the nose, sinuses, throat, trachea, and bronchi. Bronchi are the airways leading to the lungs. Most people improve within 1 week, but symptoms can last up to 2 weeks. A residual cough may last even longer.  CAUSES Many different viruses can infect the tissues lining the upper respiratory tract. The tissues become irritated and inflamed and often become very moist. Mucus production is also common. A cold is contagious. You can easily spread the virus to others by oral contact. This includes kissing, sharing a glass, coughing, or sneezing. Touching your mouth or nose and then touching a surface, which is then touched by another person, can also spread the virus. SYMPTOMS  Symptoms typically develop 1 to 3 days after you come in contact with a cold virus. Symptoms vary from person to person. They may include:  Runny nose.  Sneezing.  Nasal congestion.  Sinus irritation.  Sore throat.  Loss of voice (laryngitis).  Cough.  Fatigue.  Muscle aches.  Loss of appetite.  Headache.  Low-grade fever. DIAGNOSIS  You might diagnose your own cold based on familiar symptoms, since most people get a cold 2 to 3 times a year. Your caregiver can confirm this based on your exam. Most importantly, your caregiver can check that your symptoms are not due to another disease such as strep throat, sinusitis, pneumonia, asthma, or epiglottitis. Blood tests, throat tests, and X-rays are not necessary to diagnose a common cold, but they may sometimes be helpful in excluding other more serious diseases. Your caregiver will decide if any further tests are required. RISKS AND COMPLICATIONS  You may be at risk for a more severe case of the common cold if you smoke cigarettes, have chronic heart disease (such as heart failure) or lung disease (such as asthma), or if  you have a weakened immune system. The very young and very old are also at risk for more serious infections. Bacterial sinusitis, middle ear infections, and bacterial pneumonia can complicate the common cold. The common cold can worsen asthma and chronic obstructive pulmonary disease (COPD). Sometimes, these complications can require emergency medical care and may be life-threatening. PREVENTION  The best way to protect against getting a cold is to practice good hygiene. Avoid oral or hand contact with people with cold symptoms. Wash your hands often if contact occurs. There is no clear evidence that vitamin C, vitamin E, echinacea, or exercise reduces the chance of developing a cold. However, it is always recommended to get plenty of rest and practice good nutrition. TREATMENT  Treatment is directed at relieving symptoms. There is no cure. Antibiotics are not effective, because the infection is caused by a virus, not by bacteria. Treatment may include:  Increased fluid intake. Sports drinks offer valuable electrolytes, sugars, and fluids.  Breathing heated mist or steam (vaporizer or shower).  Eating chicken soup or other clear broths, and maintaining good nutrition.  Getting plenty of rest.  Using gargles or lozenges for comfort.  Controlling fevers with ibuprofen or acetaminophen as directed by your caregiver.  Increasing usage of your inhaler if you have asthma. Zinc gel and zinc lozenges, taken in the first 24 hours of the common cold, can shorten the duration and lessen the severity of symptoms. Pain medicines may help with fever, muscle aches, and throat pain. A variety of non-prescription medicines are available to treat congestion and runny nose. Your caregiver   can make recommendations and may suggest nasal or lung inhalers for other symptoms.  HOME CARE INSTRUCTIONS   Only take over-the-counter or prescription medicines for pain, discomfort, or fever as directed by your  caregiver.  Use a warm mist humidifier or inhale steam from a shower to increase air moisture. This may keep secretions moist and make it easier to breathe.  Drink enough water and fluids to keep your urine clear or pale yellow.  Rest as needed.  Return to work when your temperature has returned to normal or as your caregiver advises. You may need to stay home longer to avoid infecting others. You can also use a face mask and careful hand washing to prevent spread of the virus. SEEK MEDICAL CARE IF:   After the first few days, you feel you are getting worse rather than better.  You need your caregiver's advice about medicines to control symptoms.  You develop chills, worsening shortness of breath, or brown or red sputum. These may be signs of pneumonia.  You develop yellow or brown nasal discharge or pain in the face, especially when you bend forward. These may be signs of sinusitis.  You develop a fever, swollen neck glands, pain with swallowing, or white areas in the back of your throat. These may be signs of strep throat. SEEK IMMEDIATE MEDICAL CARE IF:   You have a fever.  You develop severe or persistent headache, ear pain, sinus pain, or chest pain.  You develop wheezing, a prolonged cough, cough up blood, or have a change in your usual mucus (if you have chronic lung disease).  You develop sore muscles or a stiff neck. Document Released: 08/21/2000 Document Revised: 05/20/2011 Document Reviewed: 06/02/2013 [employer] Patient Information 2015 [employer]. This information is not intended to replace advice given to you by your health care provider. Make sure you discuss any questions you have with your health care provider.  

## 2013-12-17 NOTE — Discharge Planning (Signed)
[facility] ° °Spoke to patient regarding primary care resources and establishing care with a provider. Resource guide and my contact information provided for any future questions or concerns. No other needs identified at this time. °

## 2014-03-10 ENCOUNTER — Encounter (HOSPITAL_COMMUNITY): Payer: Self-pay | Admitting: Emergency Medicine

## 2014-03-10 ENCOUNTER — Emergency Department (HOSPITAL_COMMUNITY)
Admission: EM | Admit: 2014-03-10 | Discharge: 2014-03-10 | Disposition: A | Discharge status: Home / Self Care | Payer: Self-pay | Attending: Emergency Medicine | Admitting: Emergency Medicine

## 2014-03-10 ENCOUNTER — Emergency Department (HOSPITAL_COMMUNITY): Payer: Self-pay

## 2014-03-10 DIAGNOSIS — J189 Pneumonia, unspecified organism: Secondary | ICD-10-CM

## 2014-03-10 DIAGNOSIS — Z79899 Other long term (current) drug therapy: Secondary | ICD-10-CM | POA: Insufficient documentation

## 2014-03-10 DIAGNOSIS — Z720 Tobacco use: Secondary | ICD-10-CM | POA: Insufficient documentation

## 2014-03-10 DIAGNOSIS — Z86711 Personal history of pulmonary embolism: Secondary | ICD-10-CM | POA: Insufficient documentation

## 2014-03-10 DIAGNOSIS — G43909 Migraine, unspecified, not intractable, without status migrainosus: Secondary | ICD-10-CM | POA: Insufficient documentation

## 2014-03-10 DIAGNOSIS — R059 Cough, unspecified: Secondary | ICD-10-CM

## 2014-03-10 DIAGNOSIS — I10 Essential (primary) hypertension: Secondary | ICD-10-CM | POA: Insufficient documentation

## 2014-03-10 DIAGNOSIS — R05 Cough: Secondary | ICD-10-CM

## 2014-03-10 DIAGNOSIS — J159 Unspecified bacterial pneumonia: Secondary | ICD-10-CM | POA: Insufficient documentation

## 2014-03-10 DIAGNOSIS — Z8709 Personal history of other diseases of the respiratory system: Secondary | ICD-10-CM | POA: Insufficient documentation

## 2014-03-10 LAB — I-STAT CHEM 8, ED
BUN: 10 mg/dL (ref 6–23)
Calcium, Ion: 1.14 mmol/L (ref 1.12–1.23)
Chloride: 100 mEq/L (ref 96–112)
Creatinine, Ser: 1.3 mg/dL (ref 0.50–1.35)
Glucose, Bld: 111 mg/dL — ABNORMAL HIGH (ref 70–99)
HEMATOCRIT: 49 % (ref 39.0–52.0)
HEMOGLOBIN: 16.7 g/dL (ref 13.0–17.0)
POTASSIUM: 3.6 mmol/L (ref 3.5–5.1)
SODIUM: 139 mmol/L (ref 135–145)
TCO2: 21 mmol/L (ref 0–100)

## 2014-03-10 LAB — CBC WITH DIFFERENTIAL/PLATELET
BASOS ABS: 0.1 10*3/uL (ref 0.0–0.1)
Basophils Relative: 1 % (ref 0–1)
Eosinophils Absolute: 0 10*3/uL (ref 0.0–0.7)
Eosinophils Relative: 0 % (ref 0–5)
HCT: 42 % (ref 39.0–52.0)
Hemoglobin: 14.4 g/dL (ref 13.0–17.0)
LYMPHS PCT: 19 % (ref 12–46)
Lymphs Abs: 1.6 10*3/uL (ref 0.7–4.0)
MCH: 29.9 pg (ref 26.0–34.0)
MCHC: 34.3 g/dL (ref 30.0–36.0)
MCV: 87.3 fL (ref 78.0–100.0)
Monocytes Absolute: 0.6 10*3/uL (ref 0.1–1.0)
Monocytes Relative: 8 % (ref 3–12)
NEUTROS ABS: 5.9 10*3/uL (ref 1.7–7.7)
Neutrophils Relative %: 72 % (ref 43–77)
Platelets: 137 10*3/uL — ABNORMAL LOW (ref 150–400)
RBC: 4.81 MIL/uL (ref 4.22–5.81)
RDW: 12.9 % (ref 11.5–15.5)
WBC: 8.2 10*3/uL (ref 4.0–10.5)

## 2014-03-10 MED ORDER — HYDROCHLOROTHIAZIDE 25 MG PO TABS: 25.0000 mg | ORAL_TABLET | Freq: Every day | ORAL | Status: DC

## 2014-03-10 MED ORDER — HYDROCODONE-HOMATROPINE 5-1.5 MG/5ML PO SYRP
5.0000 mL | ORAL_SOLUTION | Freq: Once | ORAL | Status: AC
  Administered 2014-03-10: 5 mL via ORAL
  Filled 2014-03-10: qty 5

## 2014-03-10 MED ORDER — GUAIFENESIN-CODEINE 100-10 MG/5ML PO SOLN: 10.0000 mL | Freq: Three times a day (TID) | ORAL | Status: DC | PRN

## 2014-03-10 MED ORDER — IBUPROFEN 800 MG PO TABS: 800.0000 mg | ORAL_TABLET | Freq: Three times a day (TID) | ORAL | Status: DC

## 2014-03-10 MED ORDER — ACETAMINOPHEN 325 MG PO TABS
650.0000 mg | ORAL_TABLET | Freq: Once | ORAL | Status: AC
  Administered 2014-03-10: 650 mg via ORAL
  Filled 2014-03-10: qty 2

## 2014-03-10 MED ORDER — AMOXICILLIN 500 MG PO CAPS
500.0000 mg | ORAL_CAPSULE | Freq: Once | ORAL | Status: AC
  Administered 2014-03-10: 500 mg via ORAL
  Filled 2014-03-10: qty 1

## 2014-03-10 MED ORDER — ACETAMINOPHEN 500 MG PO TABS: 500.0000 mg | ORAL_TABLET | Freq: Four times a day (QID) | ORAL | Status: DC | PRN

## 2014-03-10 MED ORDER — AMOXICILLIN 500 MG PO CAPS: 500.0000 mg | ORAL_CAPSULE | Freq: Three times a day (TID) | ORAL | Status: DC

## 2014-03-10 MED ORDER — IBUPROFEN 800 MG PO TABS
800.0000 mg | ORAL_TABLET | Freq: Once | ORAL | Status: AC
  Administered 2014-03-10: 800 mg via ORAL
  Filled 2014-03-10: qty 1

## 2014-03-10 NOTE — ED Notes (Signed)
PA Jennifer at bedside.  

## 2014-03-10 NOTE — Discharge Instructions (Signed)
Please follow up with your primary care physician in 1-2 days. If you do not have one please call the [facility] listed above. Please alternate between Motrin and Tylenol every three hours for fevers and pain. Please take your antibiotic until completion. Please read all discharge instructions and return precautions.   Pneumonia Pneumonia is an infection of the lungs.  CAUSES Pneumonia may be caused by bacteria or a virus. Usually, these infections are caused by breathing infectious particles into the lungs (respiratory tract). SIGNS AND SYMPTOMS   Cough.  Fever.  Chest pain.  Increased rate of breathing.  Wheezing.  Mucus production. DIAGNOSIS  If you have the common symptoms of pneumonia, your health care provider will typically confirm the diagnosis with a chest X-ray. The X-ray will show an abnormality in the lung (pulmonary infiltrate) if you have pneumonia. Other tests of your blood, urine, or sputum may be done to find the specific cause of your pneumonia. Your health care provider may also do tests (blood gases or pulse oximetry) to see how well your lungs are working. TREATMENT  Some forms of pneumonia may be spread to other people when you cough or sneeze. You may be asked to wear a mask before and during your exam. Pneumonia that is caused by bacteria is treated with antibiotic medicine. Pneumonia that is caused by the influenza virus may be treated with an antiviral medicine. Most other viral infections must run their course. These infections will not respond to antibiotics.  HOME CARE INSTRUCTIONS   Cough suppressants may be used if you are losing too much rest. However, coughing protects you by clearing your lungs. You should avoid using cough suppressants if you can.  Your health care provider may have prescribed medicine if he or she thinks your pneumonia is caused by bacteria or influenza. Finish your medicine even if you start to feel  better.  Your health care provider may also prescribe an expectorant. This loosens the mucus to be coughed up.  Take medicines only as directed by your health care provider.  Do not smoke. Smoking is a common cause of bronchitis and can contribute to pneumonia. If you are a smoker and continue to smoke, your cough may last several weeks after your pneumonia has cleared.  A cold steam vaporizer or humidifier in your room or home may help loosen mucus.  Coughing is often worse at night. Sleeping in a semi-upright position in a recliner or using a couple pillows under your head will help with this.  Get rest as you feel it is needed. Your body will usually let you know when you need to rest. PREVENTION A pneumococcal shot (vaccine) is available to prevent a common bacterial cause of pneumonia. This is usually suggested for:  People over 39 years old.  Patients on chemotherapy.  People with chronic lung problems, such as bronchitis or emphysema.  People with immune system problems. If you are over 65 or have a high risk condition, you may receive the pneumococcal vaccine if you have not received it before. In some countries, a routine influenza vaccine is also recommended. This vaccine can help prevent some cases of pneumonia.You may be offered the influenza vaccine as part of your care. If you smoke, it is time to quit. You may receive instructions on how to stop smoking. Your health care provider can provide medicines and counseling to help you quit. SEEK MEDICAL CARE IF: You have a fever. SEEK IMMEDIATE MEDICAL CARE  IF:   Your illness becomes worse. This is especially true if you are elderly or weakened from any other disease.  You cannot control your cough with suppressants and are losing sleep.  You begin coughing up blood.  You develop pain which is getting worse or is uncontrolled with medicines.  Any of the symptoms which initially brought you in for treatment are getting  worse rather than better.  You develop shortness of breath or chest pain. MAKE SURE YOU:   Understand these instructions.  Will watch your condition.  Will get help right away if you are not doing well or get worse. Document Released: 02/25/2005 Document Revised: 07/12/2013 Document Reviewed: 05/17/2010 [facility] Patient Information 2015 [place]. This information is not intended to replace advice given to you by your health care provider. Make sure you discuss any questions you have with your health care provider.

## 2014-03-10 NOTE — ED Notes (Signed)
Pt c/o generalized body aches, fever with chills and productive cough x's 4 days

## 2014-03-10 NOTE — ED Provider Notes (Signed)
CSN: [id number]     Arrival date & time 03/10/14  1722 History   First MD Initiated Contact with Patient 03/10/14 2003     Chief Complaint  Patient presents with  . Generalized Body Aches     (Consider location/radiation/quality/duration/timing/severity/associated sxs/prior Treatment) HPI Comments: Patient is a 38 year old male past medical history significant for hypertension, history of PE presenting to the emergency department for 4 days of generalized body aches, fevers, chills, productive cough. He has tried taking ibuprofen with little improvement of his symptoms, last dose was at noon today. No modifying factors identified. No sick contacts. Patient has not been taking his blood pressure medications. Denies any IV drug abuse.   Past Medical History  Diagnosis Date  . Chronic bronchitis     pt doesnt remember date  . Pneumonia   . Hypertension   . Pulmonary embolism   . Migraines    History reviewed. No pertinent past surgical history. Family History  Problem Relation Age of Onset  . Stroke Mother   . Diabetes type II Mother    History  Substance Use Topics  . Smoking status: Current Every Day Smoker -- 1.50 packs/day for 15 years    Types: Cigarettes  . Smokeless tobacco: Never Used  . Alcohol Use: Yes     Comment: Occassional Use    Review of Systems  Constitutional: Positive for fever and chills.  HENT: Positive for congestion and rhinorrhea.   Respiratory: Positive for cough and chest tightness.   Musculoskeletal: Positive for myalgias.  All other systems reviewed and are negative.     Allergies  Review of patient's allergies indicates no known allergies.  Home Medications   Prior to Admission medications   Medication Sig Start Date End Date Taking? Authorizing Provider  acetaminophen (TYLENOL) 500 MG tablet Take 1 tablet (500 mg total) by mouth every 6 (six) hours as needed. 03/10/14   Lonie Newsham L Ariyah Sedlack, PA-C  amoxicillin (AMOXIL) 500 MG capsule  Take 1 capsule (500 mg total) by mouth 3 (three) times daily. 03/10/14   Eustolia Drennen L Madelaine Whipple, PA-C  cyclobenzaprine (FLEXERIL) 10 MG tablet Take 1 tablet (10 mg total) by mouth at bedtime as needed for muscle spasms. 12/15/13   Rodolph BongEvan S Corey, MD  guaiFENesin-codeine 100-10 MG/5ML syrup Take 10 mLs by mouth 3 (three) times daily as needed for cough. 03/10/14   Wilfrid Hyser L Mariyam Remington, PA-C  hydrochlorothiazide (HYDRODIURIL) 25 MG tablet Take 1 tablet (25 mg total) by mouth daily. 03/10/14   Ethylene Reznick L Modesto Ganoe, PA-C  HYDROcodone-acetaminophen (NORCO/VICODIN) 5-325 MG per tablet Take 1 tablet by mouth every 6 (six) hours as needed for moderate pain.    Historical Provider, MD  ibuprofen (ADVIL,MOTRIN) 800 MG tablet Take 1 tablet (800 mg total) by mouth 3 (three) times daily. 03/10/14   Alandis Bluemel L Aalivia Mcgraw, PA-C  oxyCODONE-acetaminophen (PERCOCET/ROXICET) 5-325 MG per tablet Take 1-2 tablets by mouth every 6 (six) hours as needed. 12/16/13   Juliet RudeNathan R. Rubin PayorPickering, MD  promethazine (PHENERGAN) 25 MG tablet Take 1 tablet (25 mg total) by mouth every 6 (six) hours as needed for nausea. 12/16/13   Juliet RudeNathan R. Pickering, MD   BP 161/93 mmHg  Pulse 68  Temp(Src) 99.8 F (37.7 C) (Oral)  Resp 19  Ht 6\' 3"  (1.905 m)  Wt 240 lb (108.863 kg)  BMI 30.00 kg/m2  SpO2 97% Physical Exam  Constitutional: He is oriented to person, place, and time. He appears well-developed and well-nourished. No distress.  HENT:  Head: Normocephalic and  atraumatic.  Right Ear: External ear normal.  Left Ear: External ear normal.  Nose: Nose normal.  Mouth/Throat: Oropharynx is clear and moist. No oropharyngeal exudate.  Eyes: Conjunctivae are normal.  Neck: Normal range of motion. Neck supple.  Cardiovascular: Normal rate, regular rhythm and normal heart sounds.   Pulmonary/Chest: Effort normal and breath sounds normal. No respiratory distress. He has no wheezes. He has no rales. He exhibits tenderness.  Cough on  examination.   Abdominal: Soft. There is no tenderness.  Musculoskeletal: Normal range of motion.  Neurological: He is alert and oriented to person, place, and time.  Skin: Skin is warm and dry. He is not diaphoretic.  Psychiatric: He has a normal mood and affect.  Nursing note and vitals reviewed.   ED Course  Procedures (including critical care time) Medications  acetaminophen (TYLENOL) tablet 650 mg (650 mg Oral Given 03/10/14 1746)  amoxicillin (AMOXIL) capsule 500 mg (500 mg Oral Given 03/10/14 2022)  HYDROcodone-homatropine (HYCODAN) 5-1.5 MG/5ML syrup 5 mL (5 mLs Oral Given 03/10/14 2022)  ibuprofen (ADVIL,MOTRIN) tablet 800 mg (800 mg Oral Given 03/10/14 2022)    Labs Review Labs Reviewed  CBC WITH DIFFERENTIAL - Abnormal; Notable for the following:    Platelets 137 (*)    All other components within normal limits  I-STAT CHEM 8, ED - Abnormal; Notable for the following:    Glucose, Bld 111 (*)    All other components within normal limits    Imaging Review Dg Chest 2 View  03/10/2014   CLINICAL DATA:  Body aches, fevers and chills with productive cough  EXAM: CHEST  2 VIEW  COMPARISON:  12/16/2013  FINDINGS: Normal heart size. No pleural effusion or edema identified. Streaky airspace opacities within the right middle lobe are best seen on the lateral radiograph and are compatible with bronchopneumonia. The left lung is clear. The visualize osseous structures are on unremarkable.  IMPRESSION: 1. Right middle lobe bronchopneumonia.   Electronically Signed   By: Signa Kellaylor  Stroud M.D.   On: 03/10/2014 18:44     EKG Interpretation None      MDM   Final diagnoses:  Community acquired pneumonia  Essential hypertension    Filed Vitals:   03/10/14 2030  BP: 161/93  Pulse: 68  Temp:   Resp: 19   Afebrile, NAD, non-toxic appearing, AAOx4.   1) CAP: Patient has been diagnosed with CAP via chest xray. Pt is not ill appearing, immunocompromised, and does not have  multiple co morbidities, therefore I feel like the they can be treated as an OP with abx therapy. Pt has been advised to return to the ED if symptoms worsen or they do not improve.   2) HTN: Patient noted to be hypertensive in the emergency department.  No signs of hypertensive urgency.  Discussed with patient the need for close follow-up and management by their primary care physician.   Pt verbalizes understanding and is agreeable with plan. Patient is stable at time of discharge     Jeannetta EllisJennifer L Mariacristina Aday, PA-C 03/10/14 2209  Juliet RudeNathan R. Rubin PayorPickering, MD 03/10/14 [phone]

## 2014-04-08 ENCOUNTER — Emergency Department (HOSPITAL_COMMUNITY)
Admission: EM | Admit: 2014-04-08 | Discharge: 2014-04-08 | Disposition: A | Discharge status: Home / Self Care | Payer: Self-pay | Attending: Emergency Medicine | Admitting: Emergency Medicine

## 2014-04-08 ENCOUNTER — Encounter (HOSPITAL_COMMUNITY): Payer: Self-pay | Admitting: Emergency Medicine

## 2014-04-08 DIAGNOSIS — G43909 Migraine, unspecified, not intractable, without status migrainosus: Secondary | ICD-10-CM | POA: Insufficient documentation

## 2014-04-08 DIAGNOSIS — Z8701 Personal history of pneumonia (recurrent): Secondary | ICD-10-CM | POA: Insufficient documentation

## 2014-04-08 DIAGNOSIS — Z720 Tobacco use: Secondary | ICD-10-CM | POA: Insufficient documentation

## 2014-04-08 DIAGNOSIS — Z79899 Other long term (current) drug therapy: Secondary | ICD-10-CM | POA: Insufficient documentation

## 2014-04-08 DIAGNOSIS — Y9389 Activity, other specified: Secondary | ICD-10-CM | POA: Insufficient documentation

## 2014-04-08 DIAGNOSIS — Z791 Long term (current) use of non-steroidal anti-inflammatories (NSAID): Secondary | ICD-10-CM | POA: Insufficient documentation

## 2014-04-08 DIAGNOSIS — I10 Essential (primary) hypertension: Secondary | ICD-10-CM | POA: Insufficient documentation

## 2014-04-08 DIAGNOSIS — M545 Low back pain, unspecified: Secondary | ICD-10-CM

## 2014-04-08 DIAGNOSIS — Y92009 Unspecified place in unspecified non-institutional (private) residence as the place of occurrence of the external cause: Secondary | ICD-10-CM | POA: Insufficient documentation

## 2014-04-08 DIAGNOSIS — S3992XA Unspecified injury of lower back, initial encounter: Secondary | ICD-10-CM | POA: Insufficient documentation

## 2014-04-08 DIAGNOSIS — Y998 Other external cause status: Secondary | ICD-10-CM | POA: Insufficient documentation

## 2014-04-08 DIAGNOSIS — Z86711 Personal history of pulmonary embolism: Secondary | ICD-10-CM | POA: Insufficient documentation

## 2014-04-08 DIAGNOSIS — X58XXXA Exposure to other specified factors, initial encounter: Secondary | ICD-10-CM | POA: Insufficient documentation

## 2014-04-08 DIAGNOSIS — Z8709 Personal history of other diseases of the respiratory system: Secondary | ICD-10-CM | POA: Insufficient documentation

## 2014-04-08 MED ORDER — KETOROLAC TROMETHAMINE 60 MG/2ML IM SOLN
60.0000 mg | Freq: Once | INTRAMUSCULAR | Status: AC
  Administered 2014-04-08: 60 mg via INTRAMUSCULAR
  Filled 2014-04-08: qty 2

## 2014-04-08 MED ORDER — TRAMADOL HCL 50 MG PO TABS
50.0000 mg | ORAL_TABLET | Freq: Once | ORAL | Status: AC
  Administered 2014-04-08: 50 mg via ORAL
  Filled 2014-04-08: qty 1

## 2014-04-08 MED ORDER — TRAMADOL HCL 50 MG PO TABS: 50.0000 mg | ORAL_TABLET | Freq: Four times a day (QID) | ORAL | Status: DC | PRN

## 2014-04-08 MED ORDER — NAPROXEN 500 MG PO TABS: 500.0000 mg | ORAL_TABLET | Freq: Two times a day (BID) | ORAL | Status: DC

## 2014-04-08 NOTE — ED Notes (Signed)
Patient states that he was lifting heavy baskets at home and thinks he hurt his back tonight. Alert and oriented.

## 2014-04-08 NOTE — ED Provider Notes (Signed)
CSN: [id number]     Arrival date & time 04/08/14  0025 History   First MD Initiated Contact with Patient 04/08/14 0051     Chief Complaint  Patient presents with  . Back Pain     (Consider location/radiation/quality/duration/timing/severity/associated sxs/prior Treatment) Patient is a 39 y.o. male presenting with back pain. The history is provided by the patient. No language interpreter was used.  Back Pain Location:  Lumbar spine Quality:  Aching Radiates to:  Does not radiate Pain severity:  Moderate Associated symptoms: no fever and no numbness   Associated symptoms comment:  Low back pain that started after lifting heavy boxes earlier today. The pain is worse with movement, better with rest. No radiation into the lower extremities. No urinary/bowel incontinence.    Past Medical History  Diagnosis Date  . Chronic bronchitis     pt doesnt remember date  . Pneumonia   . Hypertension   . Pulmonary embolism   . Migraines    History reviewed. No pertinent past surgical history. Family History  Problem Relation Age of Onset  . Stroke Mother   . Diabetes type II Mother    History  Substance Use Topics  . Smoking status: Current Every Day Smoker -- 1.50 packs/day for 15 years    Types: Cigarettes  . Smokeless tobacco: Never Used  . Alcohol Use: Yes     Comment: Occassional Use    Review of Systems  Constitutional: Negative for fever and chills.  Respiratory: Negative.   Cardiovascular: Negative.   Gastrointestinal: Negative.   Genitourinary: Negative for enuresis.  Musculoskeletal: Positive for back pain.       See HPI  Skin: Negative.   Neurological: Negative.  Negative for numbness.      Allergies  Review of patient's allergies indicates no known allergies.  Home Medications   Prior to Admission medications   Medication Sig Start Date End Date Taking? Authorizing Provider  acetaminophen (TYLENOL) 500 MG tablet Take 1 tablet (500 mg total) by mouth every 6  (six) hours as needed. 03/10/14  Yes Jennifer L Piepenbrink, PA-C  cyclobenzaprine (FLEXERIL) 10 MG tablet Take 1 tablet (10 mg total) by mouth at bedtime as needed for muscle spasms. 12/15/13  Yes Rodolph BongEvan S Corey, MD  hydrochlorothiazide (HYDRODIURIL) 25 MG tablet Take 1 tablet (25 mg total) by mouth daily. 03/10/14  Yes Jennifer L Piepenbrink, PA-C  ibuprofen (ADVIL,MOTRIN) 800 MG tablet Take 1 tablet (800 mg total) by mouth 3 (three) times daily. 03/10/14  Yes Jennifer L Piepenbrink, PA-C  amoxicillin (AMOXIL) 500 MG capsule Take 1 capsule (500 mg total) by mouth 3 (three) times daily. Patient not taking: Reported on 04/08/2014 03/10/14   Victorino DikeJennifer L Piepenbrink, PA-C  guaiFENesin-codeine 100-10 MG/5ML syrup Take 10 mLs by mouth 3 (three) times daily as needed for cough. Patient not taking: Reported on 04/08/2014 03/10/14   Lise AuerJennifer L Piepenbrink, PA-C  HYDROcodone-acetaminophen (NORCO/VICODIN) 5-325 MG per tablet Take 1 tablet by mouth every 6 (six) hours as needed for moderate pain.    Historical Provider, MD  oxyCODONE-acetaminophen (PERCOCET/ROXICET) 5-325 MG per tablet Take 1-2 tablets by mouth every 6 (six) hours as needed. Patient not taking: Reported on 04/08/2014 12/16/13   Juliet RudeNathan R. Rubin PayorPickering, MD  promethazine (PHENERGAN) 25 MG tablet Take 1 tablet (25 mg total) by mouth every 6 (six) hours as needed for nausea. Patient not taking: Reported on 04/08/2014 12/16/13   Juliet RudeNathan R. Pickering, MD   BP 181/88 mmHg  Pulse 75  Temp(Src) 98.3  F (36.8 C) (Oral)  SpO2 100% Physical Exam  Constitutional: He is oriented to person, place, and time. He appears well-developed and well-nourished.  Neck: Normal range of motion.  Pulmonary/Chest: Effort normal.  Abdominal: Soft. He exhibits no mass. There is no tenderness.  Musculoskeletal: Normal range of motion.  Right paralumbar tenderness without swelling, discoloration. No sciatic tenderness on right. Distal pulses 2+.  Neurological: He is alert and  oriented to person, place, and time. He has normal reflexes. No sensory deficit.  Skin: Skin is warm and dry.  Psychiatric: He has a normal mood and affect.    ED Course  Procedures (including critical care time) Labs Review Labs Reviewed - No data to display  Imaging Review No results found.   EKG Interpretation None      MDM   Final diagnoses:  None    1. Low back pain  Pain that follows muscular pattern without neurologic deficits. History of same. Will treat with anti-inflammatory medications, Ultram.     Arnoldo Hooker, PA-C 04/08/14 0153  Derwood Kaplan, MD 04/08/14 [phone]

## 2014-04-08 NOTE — Discharge Instructions (Signed)
Heat Therapy Heat therapy can help ease sore, stiff, injured, and tight muscles and joints. Heat relaxes your muscles, which may help ease your pain.  RISKS AND COMPLICATIONS If you have any of the following conditions, do not use heat therapy unless your health care provider has approved:  Poor circulation.  Healing wounds or scarred skin in the area being treated.  Diabetes, heart disease, or high blood pressure.  Not being able to feel (numbness) the area being treated.  Unusual swelling of the area being treated.  Active infections.  Blood clots.  Cancer.  Inability to communicate pain. This may include young children and people who have problems with their brain function (dementia).  Pregnancy. Heat therapy should only be used on old, pre-existing, or long-lasting (chronic) injuries. Do not use heat therapy on new injuries unless directed by your health care provider. HOW TO USE HEAT THERAPY There are several different kinds of heat therapy, including:  Moist heat pack.  Warm water bath.  Hot water bottle.  Electric heating pad.  Heated gel pack.  Heated wrap.  Electric heating pad. Use the heat therapy method suggested by your health care provider. Follow your health care provider's instructions on when and how to use heat therapy. GENERAL HEAT THERAPY RECOMMENDATIONS  Do not sleep while using heat therapy. Only use heat therapy while you are awake.  Your skin may turn pink while using heat therapy. Do not use heat therapy if your skin turns red.  Do not use heat therapy if you have new pain.  High heat or long exposure to heat can cause burns. Be careful when using heat therapy to avoid burning your skin.  Do not use heat therapy on areas of your skin that are already irritated, such as with a rash or sunburn. SEEK MEDICAL CARE IF:  You have blisters, redness, swelling, or numbness.  You have new pain.  Your pain is worse. MAKE SURE  YOU:  Understand these instructions.  Will watch your condition.  Will get help right away if you are not doing well or get worse. Document Released: 05/20/2011 Document Revised: 07/12/2013 Document Reviewed: 04/20/2013 [facility] Patient Information 2015 [place], [place]. This information is not intended to replace advice given to you by your health care provider. Make sure you discuss any questions you have with your health care provider. Cryotherapy Cryotherapy means treatment with cold. Ice or gel packs can be used to reduce both pain and swelling. Ice is the most helpful within the first 24 to 48 hours after an injury or flare-up from overusing a muscle or joint. Sprains, strains, spasms, burning pain, shooting pain, and aches can all be eased with ice. Ice can also be used when recovering from surgery. Ice is effective, has very few side effects, and is safe for most people to use. PRECAUTIONS  Ice is not a safe treatment option for people with:  Raynaud phenomenon. This is a condition affecting small blood vessels in the extremities. Exposure to cold may cause your problems to return.  Cold hypersensitivity. There are many forms of cold hypersensitivity, including:  Cold urticaria. Red, itchy hives appear on the skin when the tissues begin to warm after being iced.  Cold erythema. This is a red, itchy rash caused by exposure to cold.  Cold hemoglobinuria. Red blood cells break down when the tissues begin to warm after being iced. The hemoglobin that carry oxygen are passed into the urine because they cannot combine with blood proteins fast enough.  Numbness or altered sensitivity in the area being iced. If you have any of the following conditions, do not use ice until you have discussed cryotherapy with your caregiver:  Heart conditions, such as arrhythmia, angina, or chronic heart disease.  High blood pressure.  Healing wounds or open skin in the area being iced.  Current  infections.  Rheumatoid arthritis.  Poor circulation.  Diabetes. Ice slows the blood flow in the region it is applied. This is beneficial when trying to stop inflamed tissues from spreading irritating chemicals to surrounding tissues. However, if you expose your skin to cold temperatures for too long or without the proper protection, you can damage your skin or nerves. Watch for signs of skin damage due to cold. HOME CARE INSTRUCTIONS Follow these tips to use ice and cold packs safely.  Place a dry or damp towel between the ice and skin. A damp towel will cool the skin more quickly, so you may need to shorten the time that the ice is used.  For a more rapid response, add gentle compression to the ice.  Ice for no more than 10 to 20 minutes at a time. The bonier the area you are icing, the less time it will take to get the benefits of ice.  Check your skin after 5 minutes to make sure there are no signs of a poor response to cold or skin damage.  Rest 20 minutes or more between uses.  Once your skin is numb, you can end your treatment. You can test numbness by very lightly touching your skin. The touch should be so light that you do not see the skin dimple from the pressure of your fingertip. When using ice, most people will feel these normal sensations in this order: cold, burning, aching, and numbness.  Do not use ice on someone who cannot communicate their responses to pain, such as small children or people with dementia. HOW TO MAKE AN ICE PACK Ice packs are the most common way to use ice therapy. Other methods include ice massage, ice baths, and cryosprays. Muscle creams that cause a cold, tingly feeling do not offer the same benefits that ice offers and should not be used as a substitute unless recommended by your caregiver. To make an ice pack, do one of the following:  Place crushed ice or a bag of frozen vegetables in a sealable plastic bag. Squeeze out the excess air. Place this  bag inside another plastic bag. Slide the bag into a pillowcase or place a damp towel between your skin and the bag.  Mix 3 parts water with 1 part rubbing alcohol. Freeze the mixture in a sealable plastic bag. When you remove the mixture from the freezer, it will be slushy. Squeeze out the excess air. Place this bag inside another plastic bag. Slide the bag into a pillowcase or place a damp towel between your skin and the bag. SEEK MEDICAL CARE IF:  You develop white spots on your skin. This may give the skin a blotchy (mottled) appearance.  Your skin turns blue or pale.  Your skin becomes waxy or hard.  Your swelling gets worse. MAKE SURE YOU:   Understand these instructions.  Will watch your condition.  Will get help right away if you are not doing well or get worse. Document Released: 10/22/2010 Document Revised: 07/12/2013 Document Reviewed: 10/22/2010 [facility] Patient Information 2015 [place], [place]. This information is not intended to replace advice given to you by your health care provider. Make  sure you discuss any questions you have with your health care provider. Back Pain, Adult Low back pain is very common. About 1 in 5 people have back pain.The cause of low back pain is rarely dangerous. The pain often gets better over time.About half of people with a sudden onset of back pain feel better in just 2 weeks. About 8 in 10 people feel better by 6 weeks.  CAUSES Some common causes of back pain include:  Strain of the muscles or ligaments supporting the spine.  Wear and tear (degeneration) of the spinal discs.  Arthritis.  Direct injury to the back. DIAGNOSIS Most of the time, the direct cause of low back pain is not known.However, back pain can be treated effectively even when the exact cause of the pain is unknown.Answering your caregiver's questions about your overall health and symptoms is one of the most accurate ways to make sure the cause of your pain is not  dangerous. If your caregiver needs more information, he or she may order lab work or imaging tests (X-rays or MRIs).However, even if imaging tests show changes in your back, this usually does not require surgery. HOME CARE INSTRUCTIONS For many people, back pain returns.Since low back pain is rarely dangerous, it is often a condition that people can learn to [facility] their own.   Remain active. It is stressful on the back to sit or stand in one place. Do not sit, drive, or stand in one place for more than 30 minutes at a time. Take short walks on level surfaces as soon as pain allows.Try to increase the length of time you walk each day.  Do not stay in bed.Resting more than 1 or 2 days can delay your recovery.  Do not avoid exercise or work.Your body is made to move.It is not dangerous to be active, even though your back may hurt.Your back will likely heal faster if you return to being active before your pain is gone.  Pay attention to your body when you bend and lift. Many people have less discomfortwhen lifting if they bend their knees, keep the load close to their bodies,and avoid twisting. Often, the most comfortable positions are those that put less stress on your recovering back.  Find a comfortable position to sleep. Use a firm mattress and lie on your side with your knees slightly bent. If you lie on your back, put a pillow under your knees.  Only take over-the-counter or prescription medicines as directed by your caregiver. Over-the-counter medicines to reduce pain and inflammation are often the most helpful.Your caregiver may prescribe muscle relaxant drugs.These medicines help dull your pain so you can more quickly return to your normal activities and healthy exercise.  Put ice on the injured area.  Put ice in a plastic bag.  Place a towel between your skin and the bag.  Leave the ice on for 15-20 minutes, 03-04 times a day for the first 2 to 3 days. After that, ice and  heat may be alternated to reduce pain and spasms.  Ask your caregiver about trying back exercises and gentle massage. This may be of some benefit.  Avoid feeling anxious or stressed.Stress increases muscle tension and can worsen back pain.It is important to recognize when you are anxious or stressed and learn ways to manage it.Exercise is a great option. SEEK MEDICAL CARE IF:  You have pain that is not relieved with rest or medicine.  You have pain that does not improve in 1 week.  You have new symptoms.  You are generally not feeling well. SEEK IMMEDIATE MEDICAL CARE IF:   You have pain that radiates from your back into your legs.  You develop new bowel or bladder control problems.  You have unusual weakness or numbness in your arms or legs.  You develop nausea or vomiting.  You develop abdominal pain.  You feel faint. Document Released: 02/25/2005 Document Revised: 08/27/2011 Document Reviewed: 06/29/2013 [facility] Patient Information 2015 [place], [place]. This information is not intended to replace advice given to you by your health care provider. Make sure you discuss any questions you have with your health care provider.

## 2014-07-12 ENCOUNTER — Encounter (HOSPITAL_COMMUNITY): Payer: Self-pay | Admitting: Emergency Medicine

## 2014-07-12 ENCOUNTER — Emergency Department (HOSPITAL_COMMUNITY)
Admission: EM | Admit: 2014-07-12 | Discharge: 2014-07-12 | Disposition: A | Discharge status: Home / Self Care | Payer: Self-pay | Attending: Emergency Medicine | Admitting: Emergency Medicine

## 2014-07-12 ENCOUNTER — Emergency Department (HOSPITAL_COMMUNITY): Payer: Self-pay

## 2014-07-12 DIAGNOSIS — M25522 Pain in left elbow: Secondary | ICD-10-CM

## 2014-07-12 DIAGNOSIS — S59912A Unspecified injury of left forearm, initial encounter: Secondary | ICD-10-CM | POA: Insufficient documentation

## 2014-07-12 DIAGNOSIS — S59902A Unspecified injury of left elbow, initial encounter: Secondary | ICD-10-CM | POA: Insufficient documentation

## 2014-07-12 DIAGNOSIS — Z86711 Personal history of pulmonary embolism: Secondary | ICD-10-CM | POA: Insufficient documentation

## 2014-07-12 DIAGNOSIS — S8992XA Unspecified injury of left lower leg, initial encounter: Secondary | ICD-10-CM | POA: Insufficient documentation

## 2014-07-12 DIAGNOSIS — Z79899 Other long term (current) drug therapy: Secondary | ICD-10-CM | POA: Insufficient documentation

## 2014-07-12 DIAGNOSIS — G43909 Migraine, unspecified, not intractable, without status migrainosus: Secondary | ICD-10-CM | POA: Insufficient documentation

## 2014-07-12 DIAGNOSIS — Y9389 Activity, other specified: Secondary | ICD-10-CM | POA: Insufficient documentation

## 2014-07-12 DIAGNOSIS — Z8701 Personal history of pneumonia (recurrent): Secondary | ICD-10-CM | POA: Insufficient documentation

## 2014-07-12 DIAGNOSIS — Y998 Other external cause status: Secondary | ICD-10-CM | POA: Insufficient documentation

## 2014-07-12 DIAGNOSIS — Z791 Long term (current) use of non-steroidal anti-inflammatories (NSAID): Secondary | ICD-10-CM | POA: Insufficient documentation

## 2014-07-12 DIAGNOSIS — Y9289 Other specified places as the place of occurrence of the external cause: Secondary | ICD-10-CM | POA: Insufficient documentation

## 2014-07-12 DIAGNOSIS — Z8709 Personal history of other diseases of the respiratory system: Secondary | ICD-10-CM | POA: Insufficient documentation

## 2014-07-12 DIAGNOSIS — Z23 Encounter for immunization: Secondary | ICD-10-CM | POA: Insufficient documentation

## 2014-07-12 DIAGNOSIS — Z792 Long term (current) use of antibiotics: Secondary | ICD-10-CM | POA: Insufficient documentation

## 2014-07-12 DIAGNOSIS — M25562 Pain in left knee: Secondary | ICD-10-CM

## 2014-07-12 DIAGNOSIS — I10 Essential (primary) hypertension: Secondary | ICD-10-CM | POA: Insufficient documentation

## 2014-07-12 MED ORDER — TRAMADOL HCL 50 MG PO TABS: 50.0000 mg | ORAL_TABLET | Freq: Four times a day (QID) | ORAL | Status: DC | PRN

## 2014-07-12 MED ORDER — TETANUS-DIPHTH-ACELL PERTUSSIS 5-2.5-18.5 LF-MCG/0.5 IM SUSP
0.5000 mL | Freq: Once | INTRAMUSCULAR | Status: AC
  Administered 2014-07-12: 0.5 mL via INTRAMUSCULAR
  Filled 2014-07-12: qty 0.5

## 2014-07-12 MED ORDER — TRAMADOL HCL 50 MG PO TABS
50.0000 mg | ORAL_TABLET | Freq: Once | ORAL | Status: AC
  Administered 2014-07-12: 50 mg via ORAL
  Filled 2014-07-12: qty 1

## 2014-07-12 NOTE — ED Notes (Signed)
Abrasions on Left Knee and Left elbow cleaned with 4x4 gauze and sterile saline. Bacitracin and Rolled gauze dressing applied to both abrasions on Left Elbow and Left Knee. Pt states that dressings are not painful or too tight.

## 2014-07-12 NOTE — Discharge Instructions (Signed)
Please follow up with your primary care physician in 1-2 days. If you do not have one please call the [facility] and wellness Center number listed above. Please take pain medication and/or muscle relaxants as prescribed and as needed for pain. Please do not drive on narcotic pain medication or on muscle relaxants. Please read all discharge instructions and return precautions.   Elbow Contusion An elbow contusion is a deep bruise of the elbow. Contusions are the result of an injury that caused bleeding under the skin. The contusion may turn blue, purple, or yellow. Minor injuries will give you a painless contusion, but more severe contusions may stay painful and swollen for a few weeks.  CAUSES  An elbow contusion comes from a direct force to that area, such as falling on the elbow. SYMPTOMS   Swelling and redness of the elbow.  Bruising of the elbow area.  Tenderness or soreness of the elbow. DIAGNOSIS  You will have a physical exam and will be asked about your history. You may need an X-ray of your elbow to look for a broken bone (fracture).  TREATMENT  A sling or splint may be needed to support your injury. Resting, elevating, and applying cold compresses to the elbow area are often the best treatments for an elbow contusion. Over-the-counter medicines may also be recommended for pain control. HOME CARE INSTRUCTIONS   Put ice on the injured area.  Put ice in a plastic bag.  Place a towel between your skin and the bag.  Leave the ice on for 15-20 minutes, 03-04 times a day.  Only take over-the-counter or prescription medicines for pain, discomfort, or fever as directed by your caregiver.  Rest your injured elbow until the pain and swelling are better.  Elevate your elbow to reduce swelling.  Apply a compression wrap as directed by your caregiver. This can help reduce swelling and motion. You may remove the wrap for sleeping, showers, and baths. If your fingers become numb, cold, or  blue, take the wrap off and reapply it more loosely.  Use your elbow only as directed by your caregiver. You may be asked to do range of motion exercises. Do them as directed.  See your caregiver as directed. It is very important to keep all follow-up appointments in order to avoid any long-term problems with your elbow, including chronic pain or inability to move your elbow normally. SEEK IMMEDIATE MEDICAL CARE IF:   You have increased redness, swelling, or pain in your elbow.  Your swelling or pain is not relieved with medicines.  You have swelling of the hand and fingers.  You are unable to move your fingers or wrist.  You begin to lose feeling in your hand or fingers.  Your fingers or hand become cold or blue. MAKE SURE YOU:   Understand these instructions.  Will watch your condition.  Will get help right away if you are not doing well or get worse. Document Released: 02/03/2006 Document Revised: 05/20/2011 Document Reviewed: 01/11/2011 [facility] Patient Information 2015 [place], [place]. This information is not intended to replace advice given to you by your health care provider. Make sure you discuss any questions you have with your health care provider. Knee Pain The knee is the complex joint between your thigh and your lower leg. It is made up of bones, tendons, ligaments, and cartilage. The bones that make up the knee are:  The femur in the thigh.  The tibia and fibula in the lower leg.  The  patella or kneecap riding in the groove on the lower femur. CAUSES  Knee pain is a common complaint with many causes. A few of these causes are:  Injury, such as:  A ruptured ligament or tendon injury.  Torn cartilage.  Medical conditions, such as:  Gout  Arthritis  Infections  Overuse, over training, or overdoing a physical activity. Knee pain can be minor or severe. Knee pain can accompany debilitating injury. Minor knee problems often respond well to self-care  measures or get well on their own. More serious injuries may need medical intervention or even surgery. SYMPTOMS The knee is complex. Symptoms of knee problems can vary widely. Some of the problems are:  Pain with movement and weight bearing.  Swelling and tenderness.  Buckling of the knee.  Inability to straighten or extend your knee.  Your knee locks and you cannot straighten it.  Warmth and redness with pain and fever.  Deformity or dislocation of the kneecap. DIAGNOSIS  Determining what is wrong may be very straight forward such as when there is an injury. It can also be challenging because of the complexity of the knee. Tests to make a diagnosis may include:  Your caregiver taking a history and doing a physical exam.  Routine X-rays can be used to rule out other problems. X-rays will not reveal a cartilage tear. Some injuries of the knee can be diagnosed by:  Arthroscopy a surgical technique by which a small video camera is inserted through tiny incisions on the sides of the knee. This procedure is used to examine and repair internal knee joint problems. Tiny instruments can be used during arthroscopy to repair the torn knee cartilage (meniscus).  Arthrography is a radiology technique. A contrast liquid is directly injected into the knee joint. Internal structures of the knee joint then become visible on X-ray film.  An MRI scan is a non X-ray radiology procedure in which magnetic fields and a computer produce two- or three-dimensional images of the inside of the knee. Cartilage tears are often visible using an MRI scanner. MRI scans have largely replaced arthrography in diagnosing cartilage tears of the knee.  Blood work.  Examination of the fluid that helps to lubricate the knee joint (synovial fluid). This is done by taking a sample out using a needle and a syringe. TREATMENT The treatment of knee problems depends on the cause. Some of these treatments are:  Depending on  the injury, proper casting, splinting, surgery, or physical therapy care will be needed.  Give yourself adequate recovery time. Do not overuse your joints. If you begin to get sore during workout routines, back off. Slow down or do fewer repetitions.  For repetitive activities such as cycling or running, maintain your strength and nutrition.  Alternate muscle groups. For example, if you are a weight lifter, work the upper body on one day and the lower body the next.  Either tight or weak muscles do not give the proper support for your knee. Tight or weak muscles do not absorb the stress placed on the knee joint. Keep the muscles surrounding the knee strong.  Take care of mechanical problems.  If you have flat feet, orthotics or special shoes may help. See your caregiver if you need help.  Arch supports, sometimes with wedges on the inner or outer aspect of the heel, can help. These can shift pressure away from the side of the knee most bothered by osteoarthritis.  A brace called an "unloader" brace also may be  used to help ease the pressure on the most arthritic side of the knee.  If your caregiver has prescribed crutches, braces, wraps or ice, use as directed. The acronym for this is PRICE. This means protection, rest, ice, compression, and elevation.  Nonsteroidal anti-inflammatory drugs (NSAIDs), can help relieve pain. But if taken immediately after an injury, they may actually increase swelling. Take NSAIDs with food in your stomach. Stop them if you develop stomach problems. Do not take these if you have a history of ulcers, stomach pain, or bleeding from the bowel. Do not take without your caregiver's approval if you have problems with fluid retention, heart failure, or kidney problems.  For ongoing knee problems, physical therapy may be helpful.  Glucosamine and chondroitin are over-the-counter dietary supplements. Both may help relieve the pain of osteoarthritis in the knee. These  medicines are different from the usual anti-inflammatory drugs. Glucosamine may decrease the rate of cartilage destruction.  Injections of a corticosteroid drug into your knee joint may help reduce the symptoms of an arthritis flare-up. They may provide pain relief that lasts a few months. You may have to wait a few months between injections. The injections do have a small increased risk of infection, water retention, and elevated blood sugar levels.  Hyaluronic acid injected into damaged joints may ease pain and provide lubrication. These injections may work by reducing inflammation. A series of shots may give relief for as long as 6 months.  Topical painkillers. Applying certain ointments to your skin may help relieve the pain and stiffness of osteoarthritis. Ask your pharmacist for suggestions. Many over the-counter products are approved for temporary relief of arthritis pain.  In some countries, doctors often prescribe topical NSAIDs for relief of chronic conditions such as arthritis and tendinitis. A review of treatment with NSAID creams found that they worked as well as oral medications but without the serious side effects. PREVENTION  Maintain a healthy weight. Extra pounds put more strain on your joints.  Get strong, stay limber. Weak muscles are a common cause of knee injuries. Stretching is important. Include flexibility exercises in your workouts.  Be smart about exercise. If you have osteoarthritis, chronic knee pain or recurring injuries, you may need to change the way you exercise. This does not mean you have to stop being active. If your knees ache after jogging or playing basketball, consider switching to swimming, water aerobics, or other low-impact activities, at least for a few days a week. Sometimes limiting high-impact activities will provide relief.  Make sure your shoes fit well. Choose footwear that is right for your sport.  Protect your knees. Use the proper gear for  knee-sensitive activities. Use kneepads when playing volleyball or laying carpet. Buckle your seat belt every time you drive. Most shattered kneecaps occur in car accidents.  Rest when you are tired. SEEK MEDICAL CARE IF:  You have knee pain that is continual and does not seem to be getting better.  SEEK IMMEDIATE MEDICAL CARE IF:  Your knee joint feels hot to the touch and you have a high fever. MAKE SURE YOU:   Understand these instructions.  Will watch your condition.  Will get help right away if you are not doing well or get worse. Document Released: 12/23/2006 Document Revised: 05/20/2011 Document Reviewed: 12/23/2006 [facility] Patient Information 2015 [place]. This information is not intended to replace advice given to you by your health care provider. Make sure you discuss any questions you have with your health care provider.
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
## 2015-02-20 ENCOUNTER — Emergency Department (HOSPITAL_COMMUNITY)
Admission: EM | Admit: 2015-02-20 | Discharge: 2015-02-20 | Disposition: A | Discharge status: Home / Self Care | Payer: Self-pay | Attending: Emergency Medicine | Admitting: Emergency Medicine

## 2015-02-20 ENCOUNTER — Encounter (HOSPITAL_COMMUNITY): Payer: Self-pay | Admitting: Nurse Practitioner

## 2015-02-20 DIAGNOSIS — Z8709 Personal history of other diseases of the respiratory system: Secondary | ICD-10-CM | POA: Insufficient documentation

## 2015-02-20 DIAGNOSIS — I10 Essential (primary) hypertension: Secondary | ICD-10-CM | POA: Insufficient documentation

## 2015-02-20 DIAGNOSIS — L02214 Cutaneous abscess of groin: Secondary | ICD-10-CM | POA: Insufficient documentation

## 2015-02-20 DIAGNOSIS — G43909 Migraine, unspecified, not intractable, without status migrainosus: Secondary | ICD-10-CM | POA: Insufficient documentation

## 2015-02-20 DIAGNOSIS — Z86711 Personal history of pulmonary embolism: Secondary | ICD-10-CM | POA: Insufficient documentation

## 2015-02-20 DIAGNOSIS — Z8701 Personal history of pneumonia (recurrent): Secondary | ICD-10-CM | POA: Insufficient documentation

## 2015-02-20 DIAGNOSIS — Z791 Long term (current) use of non-steroidal anti-inflammatories (NSAID): Secondary | ICD-10-CM | POA: Insufficient documentation

## 2015-02-20 DIAGNOSIS — F17210 Nicotine dependence, cigarettes, uncomplicated: Secondary | ICD-10-CM | POA: Insufficient documentation

## 2015-02-20 MED ORDER — HYDROCODONE-ACETAMINOPHEN 5-325 MG PO TABS
1.0000 | ORAL_TABLET | Freq: Once | ORAL | Status: AC
  Administered 2015-02-20: 1 via ORAL
  Filled 2015-02-20: qty 1

## 2015-02-20 MED ORDER — CEPHALEXIN 500 MG PO CAPS: 500.0000 mg | ORAL_CAPSULE | Freq: Four times a day (QID) | ORAL | Status: DC

## 2015-02-20 MED ORDER — LISINOPRIL 10 MG PO TABS: 10.0000 mg | ORAL_TABLET | Freq: Every day | ORAL | Status: DC

## 2015-02-20 MED ORDER — IBUPROFEN 800 MG PO TABS: 800.0000 mg | ORAL_TABLET | Freq: Three times a day (TID) | ORAL | Status: DC | PRN

## 2015-02-20 MED ORDER — KETOROLAC TROMETHAMINE 60 MG/2ML IM SOLN
30.0000 mg | Freq: Once | INTRAMUSCULAR | Status: AC
  Administered 2015-02-20: 30 mg via INTRAMUSCULAR
  Filled 2015-02-20: qty 2

## 2015-02-20 NOTE — ED Provider Notes (Signed)
CSN: [id number]     Arrival date & time 02/20/15  1552 History  By signing my name below, I, Elon Spanner, attest that this documentation has been prepared under the direction and in the presence of Kenith Trickel, [place]. Electronically Signed: Elon Spanner ED Scribe. 02/20/2015. 4:33 PM.    Chief Complaint  Patient presents with  . Skin Problem   The history is provided by the patient. No language interpreter was used.    HPI Comments: Alec Grant is a 39 y.o. male with no chronic conditions who presents to the Emergency Department complaining of gradually worsening, moderately painful abscess on the left upper inner thigh onset two days ago; no treatment's tried.  He denies hx of similar episodes.  He denies penile discharge/drainage, scrotal swelling, fever, numbness/tingling, dysuria, extremity numbness/weakness.      Past Medical History  Diagnosis Date  . Chronic bronchitis     pt doesnt remember date  . Pneumonia   . Hypertension   . Pulmonary embolism (HCC)   . Migraines    History reviewed. No pertinent past surgical history. Family History  Problem Relation Age of Onset  . Stroke Mother   . Diabetes type II Mother    Social History  Substance Use Topics  . Smoking status: Current Every Day Smoker -- 1.50 packs/day for 15 years    Types: Cigarettes  . Smokeless tobacco: Never Used  . Alcohol Use: Yes     Comment: Occassional Use    Review of Systems  Skin: Positive for wound.  All other systems reviewed and are negative.     Allergies  Review of patient's allergies indicates no known allergies.  Home Medications   Prior to Admission medications   Medication Sig Start Date End Date Taking? Authorizing Provider  acetaminophen (TYLENOL) 500 MG tablet Take 1 tablet (500 mg total) by mouth every 6 (six) hours as needed. 03/10/14   Jennifer Piepenbrink, PA-C  amoxicillin (AMOXIL) 500 MG capsule Take 1 capsule (500 mg total) by mouth 3 (three) times  daily. Patient not taking: Reported on 04/08/2014 03/10/14   Francee Piccolo, PA-C  cyclobenzaprine (FLEXERIL) 10 MG tablet Take 1 tablet (10 mg total) by mouth at bedtime as needed for muscle spasms. 12/15/13   Rodolph Bong, MD  guaiFENesin-codeine 100-10 MG/5ML syrup Take 10 mLs by mouth 3 (three) times daily as needed for cough. Patient not taking: Reported on 04/08/2014 03/10/14   Francee Piccolo, PA-C  hydrochlorothiazide (HYDRODIURIL) 25 MG tablet Take 1 tablet (25 mg total) by mouth daily. 03/10/14   Jennifer Piepenbrink, PA-C  HYDROcodone-acetaminophen (NORCO/VICODIN) 5-325 MG per tablet Take 1 tablet by mouth every 6 (six) hours as needed for moderate pain.    Historical Provider, MD  ibuprofen (ADVIL,MOTRIN) 800 MG tablet Take 1 tablet (800 mg total) by mouth 3 (three) times daily. 03/10/14   Jennifer Piepenbrink, PA-C  naproxen (NAPROSYN) 500 MG tablet Take 1 tablet (500 mg total) by mouth 2 (two) times daily. 04/08/14   Elpidio Anis, PA-C  oxyCODONE-acetaminophen (PERCOCET/ROXICET) 5-325 MG per tablet Take 1-2 tablets by mouth every 6 (six) hours as needed. Patient not taking: Reported on 04/08/2014 12/16/13   Benjiman Core, MD  promethazine (PHENERGAN) 25 MG tablet Take 1 tablet (25 mg total) by mouth every 6 (six) hours as needed for nausea. Patient not taking: Reported on 04/08/2014 12/16/13   Benjiman Core, MD  traMADol (ULTRAM) 50 MG tablet Take 1 tablet (50 mg total) by mouth every 6 (six) hours as  needed. 04/08/14   Elpidio AnisShari Upstill, PA-C  traMADol (ULTRAM) 50 MG tablet Take 1 tablet (50 mg total) by mouth every 6 (six) hours as needed. 07/12/14   Jennifer Piepenbrink, PA-C   BP 176/119 mmHg  Pulse 63  Temp(Src) 97.9 F (36.6 C) (Oral)  Resp 16  SpO2 100% Physical Exam  Constitutional: He is oriented to person, place, and time. He appears well-developed and well-nourished. No distress.  HENT:  Head: Normocephalic and atraumatic.  Eyes: Conjunctivae and EOM are normal.   Neck: Neck supple. No tracheal deviation present.  Cardiovascular: Normal rate.   Pulmonary/Chest: Effort normal. No respiratory distress.  Genitourinary: Testes normal and penis normal.    No penile tenderness. No discharge found.  Musculoskeletal: Normal range of motion.  Neurological: He is alert and oriented to person, place, and time.  Skin: Skin is warm and dry.  Psychiatric: He has a normal mood and affect. His behavior is normal.  Nursing note and vitals reviewed.   ED Course  Procedures (including critical care time)  DIAGNOSTIC STUDIES: Oxygen Saturation is 100% on RA, normal by my interpretation.    COORDINATION OF CARE:  4:32 PM Discussed treatment plan with patient at bedside.  Patient acknowledges and agrees with plan.    INCISION AND DRAINAGE PROCEDURE NOTE: Patient identification was confirmed and verbal consent was obtained. This procedure was performed by Noelle PennerSerena Teren Franckowiak, PA-C at 4:32 PM. Site: L groin Sterile procedures observed Needle size: 25 Anesthetic used (type and amt): 3 mL 1% lido without epi Blade size: 11 Drainage: purulent Complexity: Complex Packing used: 1/4 in iodoform gauze Site anesthetized, incision made over site, wound drained and explored loculations, rinsed with copious amounts of normal saline, wound packed with sterile gauze, covered with dry, sterile dressing.  Pt tolerated procedure well without complications.  Instructions for care discussed verbally and pt provided with additional written instructions for homecare and f/u.   Labs Review Labs Reviewed - No data to display  Imaging Review No results found. I have personally reviewed and evaluated these images and lab results as part of my medical decision-making.   EKG Interpretation None      MDM   Final diagnoses:  Essential hypertension  Abscess of left groin    Pt with abscess of left groin, likely 2/2 folliculitis. No surrounding signs of cellulitis or spread  of infection. Rest of GU exam unremarkable. He is afebrile. However given location of abscess will give outpatient course of Keflex. Resource guide given for pcp f/u. ER return precautions given.  Pt hypertensive in the ED today. States he does not follow with PCP. Pt's BP has been elevated at past visits as well. I suspect pain is also contributing to his elevated BP today. Will re-check. Will also give rx for lisinopril while pt gets established with PCP.   I personally performed the services described in this documentation, which was scribed in my presence. The recorded information has been reviewed and is accurate.   Carlene CoriaSerena Y Beck Cofer, PA-C 02/20/15 1748  Laurence Spatesachel Morgan Little, MD 02/20/15 [phone]

## 2015-02-20 NOTE — ED Notes (Signed)
Declined W/C at D/C and was escorted to lobby by RN. 

## 2015-02-20 NOTE — Discharge Instructions (Signed)
You were seen in the ER today for an abscess in your left groin. We performed an incision and drainage and the area was packed with gauze strips. The gauze will need to be removed in 24-48 hours. Please go to an urgent care or one of the clinics below for packing removal and for them to check on the healing of your abscess. I will also give you a prescription for Keflex, an antibiotic to take for the next seven days. You may take ibuprofen 800mg  every 8 hours as needed for the pain. Return to the ER for new or concerning symptoms.   Also as we discussed, your blood pressure was elevated today and will need to be re-checked by your primary care provider once you establish one.   Take medications as prescribed. Return to the emergency room for worsening condition or new concerning symptoms. Follow up with your regular doctor. If you don't have a regular doctor use one of the numbers below to establish a primary care doctor.   Emergency Department Resource Guide 1) Find a Doctor and Pay Out of Pocket Although you won't have to find out who is covered by your insurance plan, it is a good idea to ask around and get recommendations. You will then need to call the office and see if the doctor you have chosen will accept you as a new patient and what types of options they offer for patients who are self-pay. Some doctors offer discounts or will set up payment plans for their patients who do not have insurance, but you will need to ask so you aren't surprised when you get to your appointment.  2) Contact Your Local Health Department Not all health departments have doctors that can see patients for sick visits, but many do, so it is worth a call to see if yours does. If you don't know where your local health department is, you can check in your phone book. The CDC also has a tool to help you locate your state's health department, and many state websites also have listings of all of their local health  departments.  3) Find a Walk-in Clinic If your illness is not likely to be very severe or complicated, you may want to try a walk in clinic. These are popping up all over the country in pharmacies, drugstores, and shopping centers. They're usually staffed by nurse practitioners or physician assistants that have been trained to treat common illnesses and complaints. They're usually fairly quick and inexpensive. However, if you have serious medical issues or chronic medical problems, these are probably not your best option.  No Primary Care Doctor: - Call Health Connect at  [phone] - they can help you locate a primary care doctor that  accepts your insurance, provides certain services, etc. - Physician Referral [phone]- [phone]  Emergency Department Resource Guide 1) Find a Doctor and Pay Out of Pocket Although you won't have to find out who is covered by your insurance plan, it is a good idea to ask around and get recommendations. You will then need to call the office and see if the doctor you have chosen will accept you as a new patient and what types of options they offer for patients who are self-pay. Some doctors offer discounts or will set up payment plans for their patients who do not have insurance, but you will need to ask so you aren't surprised when you get to your appointment.  2) Contact Your Local Health Department Not all  health departments have doctors that can see patients for sick visits, but many do, so it is worth a call to see if yours does. If you don't know where your local health department is, you can check in your phone book. The CDC also has a tool to help you locate your state's health department, and many state websites also have listings of all of their local health departments.  3) Find a Walk-in Clinic If your illness is not likely to be very severe or complicated, you may want to try a walk in clinic. These are popping up all over the country in pharmacies,  drugstores, and shopping centers. They're usually staffed by nurse practitioners or physician assistants that have been trained to treat common illnesses and complaints. They're usually fairly quick and inexpensive. However, if you have serious medical issues or chronic medical problems, these are probably not your best option.  No Primary Care Doctor: - Call Health Connect at  [phone] - they can help you locate a primary care doctor that  accepts your insurance, provides certain services, etc. - Physician Referral Service- [phone]  Chronic Pain Problems: Organization         Address  Phone   Notes  [facility]  [phone] Patients need to be referred by their primary care doctor.   Medication Assistance: Organization         Address  Phone   Notes  [facility] Medication [facility] [address] [place] [place], [place] [address] [phone] --Must be a resident of [facility] -- Must have NO insurance coverage whatsoever (no Medicaid/ Medicare, etc.) -- The pt. MUST have a primary care doctor that directs their care regularly and follows them in the community   [facility]  [phone]   [employer]  [phone]    Agencies that provide inexpensive medical care: Organization         Address  Phone   Notes  [facility]  [phone]   [facility]    [phone]   [facility] [address] [place], [place] [address] [phone]   [facility] [address] [place], [place] [phone]   [facility]    [phone]   [facility]    [phone]   [facility] and [facility]  [address], [place] Phone:  [phone], Fax:  [phone] Hours of Operation:  9 am - 6 pm, M-F.  Also accepts Medicaid/Medicare and self-pay.  [facility]  [address], [place] Phone:  [phone], Fax: [phone]. Hours of Operation:  8:30 am - 5:30 pm, M-F.  Also accepts Medicaid and self-pay.  [facility] [place] [address] [place] Phone: [phone]   [facility] [address] Natasha Bence [place], [place] [phone], Ext. [phone] Mondays & Thursdays: 7-9 AM.  First 15 patients are seen on a first come, first serve basis.    Medicaid-accepting [facility] Providers:  Organization         Address  Phone   Notes  [facility] [address]  [phone] Also accepts self-pay patients.  [facility] [address] Laurell Josephs [place], [place]  [phone]   [facility] [address], [place] [phone]   [facility] [address] [place] [phone]   Renaye Rakers [address], [place]   [phone] Only accepts [place] patients after they have their name applied to their card.   Self-Pay (no insurance) in [facility]:  Organization         Address  Phone   Notes  Sickle Cell Patients, [facility] [address] [place], [place] [phone]   [facility] [address] [place], [place] [phone]   [facility]  [address], [place] [phone]   [facility]/Dr. Osei-Bonsu  [address] [place] or [address], [place] [phone] Phone number for both [place] and [place] locations is the same.  [facility] and [facility] [address] [place] [phone]   [facility] [address] [place] or [address] [phone]   [facility] [address] [place] [phone], phone; [phone], fax Sees patients 1st and 3rd Saturday of every month.  Must not qualify for public  or private insurance (i.e. Medicaid, Medicare, [place] Health Choice, Veterans' Benefits)  Household income should be no more than 200% of the poverty level The clinic cannot treat you if you are pregnant or think you are pregnant  Sexually transmitted diseases are not treated at the clinic.

## 2015-02-20 NOTE — ED Notes (Signed)
He c/o painful abscess to L inner thigh since Saturday. Denies drainage, fevers.

## 2015-08-10 ENCOUNTER — Emergency Department (HOSPITAL_COMMUNITY)
Admission: EM | Admit: 2015-08-10 | Discharge: 2015-08-11 | Disposition: A | Discharge status: Home / Self Care | Payer: Self-pay | Attending: Emergency Medicine | Admitting: Emergency Medicine

## 2015-08-10 ENCOUNTER — Emergency Department (HOSPITAL_COMMUNITY): Payer: Self-pay

## 2015-08-10 ENCOUNTER — Encounter (HOSPITAL_COMMUNITY): Payer: Self-pay | Admitting: Emergency Medicine

## 2015-08-10 DIAGNOSIS — R079 Chest pain, unspecified: Secondary | ICD-10-CM

## 2015-08-10 DIAGNOSIS — Z79899 Other long term (current) drug therapy: Secondary | ICD-10-CM | POA: Insufficient documentation

## 2015-08-10 DIAGNOSIS — I10 Essential (primary) hypertension: Secondary | ICD-10-CM | POA: Insufficient documentation

## 2015-08-10 DIAGNOSIS — F17210 Nicotine dependence, cigarettes, uncomplicated: Secondary | ICD-10-CM | POA: Insufficient documentation

## 2015-08-10 LAB — BASIC METABOLIC PANEL
Anion gap: 7 (ref 5–15)
BUN: 14 mg/dL (ref 6–20)
CHLORIDE: 104 mmol/L (ref 101–111)
CO2: 28 mmol/L (ref 22–32)
Calcium: 8.8 mg/dL — ABNORMAL LOW (ref 8.9–10.3)
Creatinine, Ser: 1.44 mg/dL — ABNORMAL HIGH (ref 0.61–1.24)
GFR calc Af Amer: >60 mL/min (ref >60)
GFR, EST NON AFRICAN AMERICAN: 59 mL/min — AB (ref >60)
GLUCOSE: 129 mg/dL — AB (ref 65–99)
POTASSIUM: 3.5 mmol/L (ref 3.5–5.1)
Sodium: 139 mmol/L (ref 135–145)

## 2015-08-10 LAB — CBC
HEMATOCRIT: 43.6 % (ref 39.0–52.0)
Hemoglobin: 14.8 g/dL (ref 13.0–17.0)
MCH: 30.2 pg (ref 26.0–34.0)
MCHC: 33.9 g/dL (ref 30.0–36.0)
MCV: 89 fL (ref 78.0–100.0)
Platelets: 170 10*3/uL (ref 150–400)
RBC: 4.9 MIL/uL (ref 4.22–5.81)
RDW: 13.1 % (ref 11.5–15.5)
WBC: 6.5 10*3/uL (ref 4.0–10.5)

## 2015-08-10 LAB — I-STAT TROPONIN, ED
Troponin i, poc: 0.01 ng/mL (ref 0.00–0.08)
Troponin i, poc: 0.02 ng/mL (ref 0.00–0.08)

## 2015-08-10 MED ORDER — MORPHINE SULFATE (PF) 4 MG/ML IV SOLN
4.0000 mg | Freq: Once | INTRAVENOUS | Status: AC
  Administered 2015-08-10: 4 mg via INTRAVENOUS
  Filled 2015-08-10: qty 1

## 2015-08-10 MED ORDER — IOPAMIDOL (ISOVUE-370) INJECTION 76%
100.0000 mL | Freq: Once | INTRAVENOUS | Status: AC | PRN
  Administered 2015-08-10: 100 mL via INTRAVENOUS

## 2015-08-10 MED ORDER — LISINOPRIL 10 MG PO TABS: 10.0000 mg | ORAL_TABLET | Freq: Every day | ORAL | Status: DC

## 2015-08-10 MED ORDER — ASPIRIN 81 MG PO CHEW
324.0000 mg | CHEWABLE_TABLET | Freq: Once | ORAL | Status: AC
  Administered 2015-08-10: 324 mg via ORAL
  Filled 2015-08-10: qty 4
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
## 2015-08-10 NOTE — Discharge Instructions (Signed)
Chest Wall Pain Chest wall pain is pain in or around the bones and muscles of your chest. Sometimes, an injury causes this pain. Sometimes, the cause may not be known. This pain may take several weeks or longer to get better. HOME CARE Pay attention to any changes in your symptoms. Take these actions to help with your pain:  Rest as told by your doctor.  Avoid activities that cause pain. Try not to use your chest, belly (abdominal), or side muscles to lift heavy things.  If directed, apply ice to the painful area:  Put ice in a plastic bag.  Place a towel between your skin and the bag.  Leave the ice on for 20 minutes, 2-3 times per day.  Take over-the-counter and prescription medicines only as told by your doctor.  Do not use tobacco products, including cigarettes, chewing tobacco, and e-cigarettes. If you need help quitting, ask your doctor.  Keep all follow-up visits as told by your doctor. This is important. GET HELP IF:  You have a fever.  Your chest pain gets worse.  You have new symptoms. GET HELP RIGHT AWAY IF:  You feel sick to your stomach (nauseous) or you throw up (vomit).  You feel sweaty or light-headed.  You have a cough with phlegm (sputum) or you cough up blood.  You are short of breath.   This information is not intended to replace advice given to you by your health care provider. Make sure you discuss any questions you have with your health care provider.  Follow up with [facility] for re-evaluation. Take ibuprofen as needed for pain. Return to the ED if you experience severe worsening of your symptoms, difficulty breathing, loss of consciousness, abdominal pain. Take blood pressure medication as prescribed.

## 2015-08-10 NOTE — ED Notes (Signed)
Patient transported to CT 

## 2015-08-10 NOTE — ED Provider Notes (Signed)
CSN: [id number]     Arrival date & time 08/10/15  1729 History   First MD Initiated Contact with Patient 08/10/15 1937     Chief Complaint  Patient presents with  . Chest Pain     (Consider location/radiation/quality/duration/timing/severity/associated sxs/prior Treatment) HPI  Alec Grant  Is a 40 y.o M with a pmhx of PE, HTN who presents to the ED today c/o chest pain. Pt states that about an hour ago He was standing in his living room when he felt acute onset sharp left-sided chest pain. He states the pain frequently comes and goes but feels like someone is stabbing him in his chest. Pain does not radiate but is 10 out of 10. He states he feels associated shortness of breath when the pain comes on. He states this feels similar to the chest pain he had in 2007 when he had a pulmonary embolism. He was taking blood thinners for a while but has not been taking them for a long time. He also states that he has known hypertension but is not taking any medication for this. He denies any associated dizziness, paresthesias, weakness, lower external swelling, calf tenderness, recent travel. No history of diabetes or CAD.  Past Medical History  Diagnosis Date  . Chronic bronchitis     pt doesnt remember date  . Pneumonia   . Hypertension   . Pulmonary embolism (HCC)   . Migraines    History reviewed. No pertinent past surgical history. Family History  Problem Relation Age of Onset  . Stroke Mother   . Diabetes type II Mother    Social History  Substance Use Topics  . Smoking status: Current Every Day Smoker -- 1.50 packs/day for 15 years    Types: Cigarettes  . Smokeless tobacco: Never Used  . Alcohol Use: Yes     Comment: Occassional Use    Review of Systems  All other systems reviewed and are negative.     Allergies  Review of patient's allergies indicates no known allergies.  Home Medications   Prior to Admission medications   Medication Sig Start Date End Date  Taking? Authorizing Provider  acetaminophen (TYLENOL) 500 MG tablet Take 1 tablet (500 mg total) by mouth every 6 (six) hours as needed. Patient not taking: Reported on 08/10/2015 03/10/14   Francee PiccoloJennifer Piepenbrink, PA-C  amoxicillin (AMOXIL) 500 MG capsule Take 1 capsule (500 mg total) by mouth 3 (three) times daily. Patient not taking: Reported on 04/08/2014 03/10/14   Francee PiccoloJennifer Piepenbrink, PA-C  cephALEXin (KEFLEX) 500 MG capsule Take 1 capsule (500 mg total) by mouth 4 (four) times daily. Patient not taking: Reported on 08/10/2015 02/20/15   Ace GinsSerena Y Sam, PA-C  cyclobenzaprine (FLEXERIL) 10 MG tablet Take 1 tablet (10 mg total) by mouth at bedtime as needed for muscle spasms. Patient not taking: Reported on 08/10/2015 12/15/13   Rodolph BongEvan S Corey, MD  guaiFENesin-codeine 100-10 MG/5ML syrup Take 10 mLs by mouth 3 (three) times daily as needed for cough. Patient not taking: Reported on 04/08/2014 03/10/14   Francee PiccoloJennifer Piepenbrink, PA-C  hydrochlorothiazide (HYDRODIURIL) 25 MG tablet Take 1 tablet (25 mg total) by mouth daily. Patient not taking: Reported on 08/10/2015 03/10/14   Francee PiccoloJennifer Piepenbrink, PA-C  ibuprofen (ADVIL,MOTRIN) 800 MG tablet Take 1 tablet (800 mg total) by mouth 3 (three) times daily. Patient not taking: Reported on 08/10/2015 03/10/14   Francee PiccoloJennifer Piepenbrink, PA-C  ibuprofen (ADVIL,MOTRIN) 800 MG tablet Take 1 tablet (800 mg total) by mouth every 8 (eight) hours  as needed. Patient not taking: Reported on 08/10/2015 02/20/15   Ace Gins Sam, PA-C  lisinopril (PRINIVIL,ZESTRIL) 10 MG tablet Take 1 tablet (10 mg total) by mouth daily. Patient not taking: Reported on 08/10/2015 02/20/15   Ace Gins Sam, PA-C  naproxen (NAPROSYN) 500 MG tablet Take 1 tablet (500 mg total) by mouth 2 (two) times daily. Patient not taking: Reported on 08/10/2015 04/08/14   Elpidio Anis, PA-C  oxyCODONE-acetaminophen (PERCOCET/ROXICET) 5-325 MG per tablet Take 1-2 tablets by mouth every 6 (six) hours as needed. Patient not  taking: Reported on 04/08/2014 12/16/13   Benjiman Core, MD  promethazine (PHENERGAN) 25 MG tablet Take 1 tablet (25 mg total) by mouth every 6 (six) hours as needed for nausea. Patient not taking: Reported on 04/08/2014 12/16/13   Benjiman Core, MD  traMADol (ULTRAM) 50 MG tablet Take 1 tablet (50 mg total) by mouth every 6 (six) hours as needed. Patient not taking: Reported on 08/10/2015 04/08/14   Elpidio Anis, PA-C  traMADol (ULTRAM) 50 MG tablet Take 1 tablet (50 mg total) by mouth every 6 (six) hours as needed. Patient not taking: Reported on 08/10/2015 07/12/14   Victorino Dike Piepenbrink, PA-C   BP 147/88 mmHg  Pulse 68  Temp(Src) 98.2 F (36.8 C) (Oral)  Resp 20  SpO2 99% Physical Exam  Constitutional: He is oriented to person, place, and time. He appears well-developed and well-nourished. No distress.  HENT:  Head: Normocephalic and atraumatic.  Mouth/Throat: No oropharyngeal exudate.  Eyes: Conjunctivae and EOM are normal. Pupils are equal, round, and reactive to light. Right eye exhibits no discharge. Left eye exhibits no discharge. No scleral icterus.  Neck: Neck supple.  Cardiovascular: Normal rate, regular rhythm, normal heart sounds and intact distal pulses.  Exam reveals no gallop and no friction rub.   No murmur heard. Pulmonary/Chest: Effort normal and breath sounds normal. No respiratory distress. He has no wheezes. He has no rales. He exhibits no tenderness.  Abdominal: Soft. He exhibits no distension. There is no tenderness. There is no guarding.  Musculoskeletal: Normal range of motion. He exhibits no edema.  Lymphadenopathy:    He has no cervical adenopathy.  Neurological: He is alert and oriented to person, place, and time.  Strength 5/5 throughout. No sensory deficits.    Skin: Skin is warm and dry. No rash noted. He is not diaphoretic. No erythema. No pallor.  Psychiatric: He has a normal mood and affect. His behavior is normal.  Nursing note and vitals  reviewed.   ED Course  Procedures (including critical care time) Labs Review Labs Reviewed  BASIC METABOLIC PANEL - Abnormal; Notable for the following:    Glucose, Bld 129 (*)    Creatinine, Ser 1.44 (*)    Calcium 8.8 (*)    GFR calc non Af Amer 59 (*)    All other components within normal limits  CBC  I-STAT TROPOININ, ED    Imaging Review Dg Chest 2 View  08/10/2015  CLINICAL DATA:  Pt states he began having left sided/central chest pain around 17:30 this afternoon similar to that when he last had a blood clot (in 2007). Hx of HTN. Smoker EXAM: CHEST  2 VIEW COMPARISON:  03/10/2014 FINDINGS: The heart size and mediastinal contours are within normal limits. Both lungs are clear. No pleural effusion or pneumothorax. The visualized skeletal structures are unremarkable. IMPRESSION: No active cardiopulmonary disease. Electronically Signed   By: Amie Portland M.D.   On: 08/10/2015 18:32   I have personally  reviewed and evaluated these images and lab results as part of my medical decision-making.   EKG Interpretation   Date/Time:  Thursday August 10 2015 17:40:30 EDT Ventricular Rate:  67 PR Interval:  157 QRS Duration: 86 QT Interval:  415 QTC Calculation: 438 R Axis:   47 Text Interpretation:  Sinus rhythm Left atrial enlargement RSR' in V1 or  V2, probably normal variant Probable left ventricular hypertrophy No  significant change since last tracing Confirmed by ALLEN  MD, ANTHONY  ([id number]) on 08/10/2015 7:44:34 PM      MDM   Final diagnoses:  Chest pain, unspecified chest pain type    40 year old male with history of PE, HTN presents the ED complaining of left-sided chest pain that began 1 hour prior to arrival. On presentation to ED patient appears uncomfortable but is in no apparent distress. Blood pressure elevated 159/102. Patient states he has had high blood pressure first lower years but does not take any medication for this. He was previously prescribed lisinopril  which he states he does not take as he has no one to prescribe it to him. No hypoxia or tachycardia. No lower extremity swelling. EKG unremarkable. No sign of ischemia. Patient is low risk heart score. Initial troponin 0. 01. Repeat troponin also within normal limits. Low suspicion ACS. However, given history of PE patient is high risk. He also states is the same pain he felt last time he had a pulmonary embolism. CTA ordered to evaluate which was negative for PE and did not show any evidence of aortic dissection or plaque. Patient was given morphine and aspirin while in the ED with significant relief in his symptoms. I discussed with patient importance of establishing primary care. We'll refill his blood pressure medication today. His creatinine is mildly elevated 1.44. This appears to be patient's baseline when compared to previous periods likely secondary to long-standing hypertension without treatment. I discussed with patient the consequences of his hypertension and he expresses complete understanding. Follow up with PCP as indicated. Return precautions outlined in patient discharge instructions.  Case discussed with Dr. Freida Busman who agrees with treatment plan.    Alec Kinsman [place], PA-C 08/11/15 0109  Lorre Nick, MD 08/13/15 2256

## 2015-08-10 NOTE — Progress Notes (Signed)
EDCM spoke to patient at bedside. Patient confirms he does not have a pcp or insurance [place].  [facility] informed patient of services there.  EDCM also provided patient with list of pcps who accept self pay patients, list of discount pharmacies and websites needymeds.org and GoodRX.com for medication assistance, phone number to inquire about the orange card, phone number to inquire about Medicaid, phone number to inquire about the Affordable Care Act, financial resources in the community such as local churches, [employer], urban ministries, and dental assistance for uninsured patients.  Patient thankful for resources.  No further EDCM needs at this time.

## 2015-10-26 ENCOUNTER — Encounter (HOSPITAL_COMMUNITY): Payer: Self-pay | Admitting: Emergency Medicine

## 2015-10-26 ENCOUNTER — Emergency Department (HOSPITAL_COMMUNITY)
Admission: EM | Admit: 2015-10-26 | Discharge: 2015-10-26 | Disposition: A | Discharge status: Home / Self Care | Payer: No Typology Code available for payment source | Attending: Emergency Medicine | Admitting: Emergency Medicine

## 2015-10-26 DIAGNOSIS — M25532 Pain in left wrist: Secondary | ICD-10-CM | POA: Insufficient documentation

## 2015-10-26 DIAGNOSIS — Z791 Long term (current) use of non-steroidal anti-inflammatories (NSAID): Secondary | ICD-10-CM | POA: Insufficient documentation

## 2015-10-26 DIAGNOSIS — Y92410 Unspecified street and highway as the place of occurrence of the external cause: Secondary | ICD-10-CM | POA: Diagnosis not present

## 2015-10-26 DIAGNOSIS — M549 Dorsalgia, unspecified: Secondary | ICD-10-CM | POA: Diagnosis not present

## 2015-10-26 DIAGNOSIS — Y939 Activity, unspecified: Secondary | ICD-10-CM | POA: Insufficient documentation

## 2015-10-26 DIAGNOSIS — M25561 Pain in right knee: Secondary | ICD-10-CM | POA: Insufficient documentation

## 2015-10-26 DIAGNOSIS — F17210 Nicotine dependence, cigarettes, uncomplicated: Secondary | ICD-10-CM | POA: Diagnosis not present

## 2015-10-26 DIAGNOSIS — I10 Essential (primary) hypertension: Secondary | ICD-10-CM | POA: Insufficient documentation

## 2015-10-26 DIAGNOSIS — Y999 Unspecified external cause status: Secondary | ICD-10-CM | POA: Insufficient documentation

## 2015-10-26 DIAGNOSIS — M79602 Pain in left arm: Secondary | ICD-10-CM

## 2015-10-26 DIAGNOSIS — M25562 Pain in left knee: Secondary | ICD-10-CM | POA: Insufficient documentation

## 2015-10-26 DIAGNOSIS — Z79899 Other long term (current) drug therapy: Secondary | ICD-10-CM | POA: Insufficient documentation
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
## 2015-10-26 MED ORDER — IBUPROFEN 800 MG PO TABS
800.0000 mg | ORAL_TABLET | Freq: Once | ORAL | Status: AC
  Administered 2015-10-26: 800 mg via ORAL
  Filled 2015-10-26: qty 1
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
## 2015-10-26 NOTE — Discharge Instructions (Signed)
Please use ibuprofen or Tylenol as needed for discomfort. Please monitor for any new or worsening signs or symptoms and return immediately if they present.
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
## 2016-01-21 ENCOUNTER — Emergency Department (HOSPITAL_COMMUNITY)
Admission: EM | Admit: 2016-01-21 | Discharge: 2016-01-21 | Disposition: A | Discharge status: Home / Self Care | Payer: Self-pay | Attending: Emergency Medicine | Admitting: Emergency Medicine

## 2016-01-21 ENCOUNTER — Emergency Department (HOSPITAL_COMMUNITY): Payer: Self-pay

## 2016-01-21 ENCOUNTER — Encounter (HOSPITAL_COMMUNITY): Payer: Self-pay | Admitting: *Deleted

## 2016-01-21 DIAGNOSIS — F17210 Nicotine dependence, cigarettes, uncomplicated: Secondary | ICD-10-CM | POA: Insufficient documentation

## 2016-01-21 DIAGNOSIS — R51 Headache: Secondary | ICD-10-CM | POA: Insufficient documentation

## 2016-01-21 DIAGNOSIS — I10 Essential (primary) hypertension: Secondary | ICD-10-CM | POA: Insufficient documentation

## 2016-01-21 DIAGNOSIS — R519 Headache, unspecified: Secondary | ICD-10-CM

## 2016-01-21 MED ORDER — LISINOPRIL 10 MG PO TABS: 10.0000 mg | ORAL_TABLET | Freq: Every day | ORAL | 0 refills | Status: AC

## 2016-01-21 MED ORDER — SODIUM CHLORIDE 0.9 % IV BOLUS (SEPSIS)
1000.0000 mL | Freq: Once | INTRAVENOUS | Status: AC
  Administered 2016-01-21: 1000 mL via INTRAVENOUS

## 2016-01-21 MED ORDER — PROCHLORPERAZINE EDISYLATE 5 MG/ML IJ SOLN
10.0000 mg | Freq: Once | INTRAMUSCULAR | Status: AC
  Administered 2016-01-21: 10 mg via INTRAVENOUS
  Filled 2016-01-21: qty 2

## 2016-01-21 MED ORDER — HYDROCHLOROTHIAZIDE 25 MG PO TABS: 25.0000 mg | ORAL_TABLET | Freq: Every day | ORAL | 0 refills | Status: DC

## 2016-01-21 MED ORDER — HYDROCHLOROTHIAZIDE 25 MG PO TABS
25.0000 mg | ORAL_TABLET | Freq: Once | ORAL | Status: AC
  Administered 2016-01-21: 25 mg via ORAL
  Filled 2016-01-21: qty 1

## 2016-01-21 MED ORDER — ACETAMINOPHEN 500 MG PO TABS
1000.0000 mg | ORAL_TABLET | Freq: Once | ORAL | Status: AC
  Administered 2016-01-21: 1000 mg via ORAL
  Filled 2016-01-21: qty 2

## 2016-01-21 MED ORDER — DIPHENHYDRAMINE HCL 50 MG/ML IJ SOLN
25.0000 mg | Freq: Once | INTRAMUSCULAR | Status: AC
  Administered 2016-01-21: 25 mg via INTRAVENOUS
  Filled 2016-01-21: qty 1

## 2016-01-21 MED ORDER — LISINOPRIL 10 MG PO TABS
10.0000 mg | ORAL_TABLET | Freq: Once | ORAL | Status: AC
  Administered 2016-01-21: 10 mg via ORAL
  Filled 2016-01-21: qty 1

## 2016-01-21 NOTE — ED Triage Notes (Signed)
Pt c/o headache at 12pm today while watching TV. Pt also reports light and noise sensitivity.

## 2016-01-21 NOTE — ED Provider Notes (Signed)
[facility]-EMERGENCY DEPT Provider Note   CSN: [id number] Arrival date & time: 01/21/16  1901     History   Chief Complaint Chief Complaint  Patient presents with  . Headache    HPI Alec Grant is a 40 y.o. male.  Patient presents with headache that started approximately 6 hours prior to arrival at 1PM today while he was watching television. Headache associated with photophobia and phonophobia, no nausea or vomiting. He denies head trauma. No recent fevers. He has had migraines in the past though reports it has been many years and this headache is worse. He reports history of hypertension and is supposed to be taking lisinopril and HCTZ but has not taken this in weeks.   The history is provided by the patient. No language interpreter was used.  Headache   This is a new problem. The current episode started 3 to 5 hours ago. The problem occurs constantly. The problem has not changed since onset.The headache is associated with bright light. The pain is located in the bilateral and frontal region. The pain is at a severity of 10/10. The pain is moderate. The pain does not radiate. Pertinent negatives include no fever, no malaise/fatigue, no chest pressure, no syncope, no shortness of breath, no nausea and no vomiting. He has tried nothing for the symptoms. The treatment provided no relief.    Past Medical History:  Diagnosis Date  . Chronic bronchitis    pt doesnt remember date  . Hypertension   . Migraines   . Pneumonia   . Pulmonary embolism [facility])     Patient Active Problem List   Diagnosis Date Noted  . Migraines   . External hemorrhoid, thrombosed 05/21/2011  . HTN (hypertension), malignant 03/09/2011  . Pneumonia 03/08/2011    No past surgical history on file.     Home Medications    Prior to Admission medications   Medication Sig Start Date End Date Taking? Authorizing Provider  hydrochlorothiazide (HYDRODIURIL) 25 MG tablet Take 1 tablet (25 mg total) by  mouth daily. 01/21/16   Preston Fleeting, MD  ibuprofen (ADVIL,MOTRIN) 800 MG tablet Take 1 tablet (800 mg total) by mouth every 8 (eight) hours as needed. Patient not taking: Reported on 08/10/2015 02/20/15   Ace Gins Sam, PA-C  lisinopril (PRINIVIL,ZESTRIL) 10 MG tablet Take 1 tablet (10 mg total) by mouth daily. 01/21/16   Preston Fleeting, MD  traMADol (ULTRAM) 50 MG tablet Take 1 tablet (50 mg total) by mouth every 6 (six) hours as needed. Patient not taking: Reported on 08/10/2015 07/12/14   Francee Piccolo, PA-C    Family History Family History  Problem Relation Age of Onset  . Stroke Mother   . Diabetes type II Mother     Social History Social History  Substance Use Topics  . Smoking status: Current Every Day Smoker    Packs/day: 1.50    Years: 15.00    Types: Cigarettes  . Smokeless tobacco: Never Used  . Alcohol use Yes     Comment: Occassional Use     Allergies   Patient has no known allergies.   Review of Systems Review of Systems  Constitutional: Negative for fever and malaise/fatigue.  HENT: Negative for congestion.   Respiratory: Negative for shortness of breath.   Cardiovascular: Negative for chest pain and syncope.  Gastrointestinal: Negative for nausea and vomiting.  Genitourinary: Negative.   Musculoskeletal: Negative for neck pain.  Skin: Negative.   Allergic/Immunologic: Negative for immunocompromised state.  Neurological: Positive for headaches.  Negative for seizures, weakness and numbness.  Hematological: Does not bruise/bleed easily.  Psychiatric/Behavioral: Negative.      Physical Exam Updated Vital Signs BP (!) 180/113 (BP Location: Left Arm)   Pulse (!) 52   Temp 97.8 F (36.6 C) (Oral)   Resp 16   SpO2 99%   Physical Exam  Constitutional: He is oriented to person, place, and time. He appears well-developed and well-nourished. He is cooperative.  Non-toxic appearance. He appears distressed.  Sitting in darkened room with eyes closed    HENT:  Head: Normocephalic and atraumatic.  Eyes: Conjunctivae and EOM are normal. Pupils are equal, round, and reactive to light. No scleral icterus.  Neck: Normal range of motion. Neck supple.  No meningismus  Cardiovascular: Normal rate and regular rhythm.   Pulmonary/Chest: Effort normal. No respiratory distress.  Neurological: He is alert and oriented to person, place, and time.  Alert and oriented x 4. CN II-XII intact with no facial droop, no aphasia or dysarthria. PERRL. 5/5 strength of hip flexion/extension, dorsiflexion/plantarflexion. 5/5 strength of shoulder and elbow flexion/extension and grip strength bilaterally. Normal finger-to-nose bilaterally without ataxia. Symmetric 2+ patellar reflexes. No ankle clonus.  Skin: Skin is warm and dry. He is not diaphoretic.  Psychiatric: He has a normal mood and affect. His behavior is normal. Judgment and thought content normal.     ED Treatments / Results  Labs (all labs ordered are listed, but only abnormal results are displayed) Labs Reviewed - No data to display  EKG  EKG Interpretation None       Radiology Ct Head Wo Contrast  Result Date: 01/21/2016 CLINICAL DATA:  Headache, migraines EXAM: CT HEAD WITHOUT CONTRAST TECHNIQUE: Contiguous axial images were obtained from the base of the skull through the vertex without intravenous contrast. COMPARISON:  None. FINDINGS: Brain: No evidence of acute infarction, hemorrhage, hydrocephalus, extra-axial collection or mass lesion/mass effect. Vascular: No hyperdense vessel or unexpected calcification. Skull: No osseous abnormality. Sinuses/Orbits: Visualized paranasal sinuses are clear. Visualized mastoid sinuses are clear. Visualized orbits demonstrate no focal abnormality. Other: None IMPRESSION: No acute intracranial pathology. Electronically Signed   By: Elige KoHetal  Patel   On: 01/21/2016 20:48    Procedures Procedures (including critical care time)  Medications Ordered in  ED Medications  prochlorperazine (COMPAZINE) injection 10 mg (10 mg Intravenous Given 01/21/16 1955)  diphenhydrAMINE (BENADRYL) injection 25 mg (25 mg Intravenous Given 01/21/16 1954)  acetaminophen (TYLENOL) tablet 1,000 mg (1,000 mg Oral Given 01/21/16 1955)  sodium chloride 0.9 % bolus 1,000 mL (0 mLs Intravenous Stopped 01/21/16 2121)  lisinopril (PRINIVIL,ZESTRIL) tablet 10 mg (10 mg Oral Given 01/21/16 1957)  hydrochlorothiazide (HYDRODIURIL) tablet 25 mg (25 mg Oral Given 01/21/16 1957)     Initial Impression / Assessment and Plan / ED Course  I have reviewed the triage vital signs and the nursing notes.  Pertinent labs & imaging results that were available during my care of the patient were reviewed by me and considered in my medical decision making (see chart for details).  Clinical Course     Patient presents with six hours of acute onset headache, photophobia, phonophobia. He is uncomfortable but overall well-appearing on examination with normal mentation. He has no neurological deficits. He is afebrile with no meningismus, do not suspect meningitis. CT scan obtained to evaluate for intracranial hemorrhage given his hypertension and acute onset headache. This was negative for acute bleed. Symptoms improved after a headache cocktail. Suspect is hypertension here is due to noncompliance with antihypertensives in  the setting of known HTN. Do not suspect hypertensive emergency as he has a negative CT head and no other symptoms to indicate end organ damage. He was given his home BP meds and his BP improved to 150s systolic. Was given prescriptions for his home BP meds and instructed [facility]. He expressed understanding of instructions and is in good condition for discharge home.  Final Clinical Impressions(s) / ED Diagnoses   Final diagnoses:  Acute nonintractable headache, unspecified headache type    New Prescriptions Discharge  Medication List as of 01/21/2016  9:15 PM       Preston FleetingAnna Dalicia Kisner, MD 01/22/16 0045    Charlynne Panderavid Hsienta Yao, MD 01/25/16 1052

## 2016-01-21 NOTE — ED Notes (Signed)
Taken to CT at this time. 

## 2017-07-27 ENCOUNTER — Other Ambulatory Visit: Payer: Self-pay

## 2017-07-27 ENCOUNTER — Emergency Department
Admission: EM | Admit: 2017-07-27 | Discharge: 2017-07-27 | Disposition: A | Discharge status: Home / Self Care | Payer: Self-pay | Attending: Emergency Medicine | Admitting: Emergency Medicine

## 2017-07-27 ENCOUNTER — Encounter: Payer: Self-pay | Admitting: Emergency Medicine

## 2017-07-27 DIAGNOSIS — Z79899 Other long term (current) drug therapy: Secondary | ICD-10-CM | POA: Insufficient documentation

## 2017-07-27 DIAGNOSIS — I10 Essential (primary) hypertension: Secondary | ICD-10-CM | POA: Insufficient documentation

## 2017-07-27 DIAGNOSIS — L0231 Cutaneous abscess of buttock: Secondary | ICD-10-CM | POA: Insufficient documentation

## 2017-07-27 DIAGNOSIS — F17210 Nicotine dependence, cigarettes, uncomplicated: Secondary | ICD-10-CM | POA: Insufficient documentation

## 2017-07-27 MED ORDER — OXYCODONE-ACETAMINOPHEN 5-325 MG PO TABS
1.0000 | ORAL_TABLET | Freq: Once | ORAL | Status: AC
  Administered 2017-07-27: 1 via ORAL
  Filled 2017-07-27: qty 1

## 2017-07-27 MED ORDER — SULFAMETHOXAZOLE-TRIMETHOPRIM 800-160 MG PO TABS
1.0000 | ORAL_TABLET | Freq: Once | ORAL | Status: AC
  Administered 2017-07-27: 1 via ORAL
  Filled 2017-07-27: qty 1

## 2017-07-27 MED ORDER — OXYCODONE-ACETAMINOPHEN 7.5-325 MG PO TABS
1.0000 | ORAL_TABLET | Freq: Four times a day (QID) | ORAL | 0 refills | Status: DC | PRN
Start: 2017-07-27 — End: 2018-01-11

## 2017-07-27 MED ORDER — NAPROXEN 500 MG PO TABS: 500.0000 mg | ORAL_TABLET | Freq: Two times a day (BID) | ORAL | Status: DC

## 2017-07-27 MED ORDER — SULFAMETHOXAZOLE-TRIMETHOPRIM 800-160 MG PO TABS: 1.0000 | ORAL_TABLET | Freq: Two times a day (BID) | ORAL | 0 refills | Status: DC

## 2017-07-27 MED ORDER — NAPROXEN 500 MG PO TABS
500.0000 mg | ORAL_TABLET | Freq: Once | ORAL | Status: AC
  Administered 2017-07-27: 500 mg via ORAL
  Filled 2017-07-27: qty 1

## 2017-07-27 NOTE — ED Notes (Signed)
See triage note  Presents with possible abscess area to buttocks  States he noticed area couple of days ago  No fever

## 2017-07-27 NOTE — Discharge Instructions (Signed)
Purchase over-the-counter Epson salt and add to sitz bath

## 2017-07-27 NOTE — ED Provider Notes (Signed)
[facility] Emergency Department Provider Note   ____________________________________________   First MD Initiated Contact with Patient 07/27/17 [phone]     (approximate)  I have reviewed the triage vital signs and the nursing notes.   HISTORY  Chief Complaint Abscess    HPI Alec Grant is a 42 y.o. male patient presents for abscess to the left buttock for 2 days.  Patient states increased pain with sitting.  Patient denies any drainage from lesion.  Patient rates pain as a 9/10.  Patient described the pain is "aching/throbbing.  No drainage from lesion.  Past Medical History:  Diagnosis Date  . Chronic bronchitis    pt doesnt remember date  . Hypertension   . Migraines   . Pneumonia   . Pulmonary embolism [facility])     Patient Active Problem List   Diagnosis Date Noted  . Migraines   . External hemorrhoid, thrombosed 05/21/2011  . HTN (hypertension), malignant 03/09/2011  . Pneumonia 03/08/2011    History reviewed. No pertinent surgical history.  Prior to Admission medications   Medication Sig Start Date End Date Taking? Authorizing Provider  hydrochlorothiazide (HYDRODIURIL) 25 MG tablet Take 1 tablet (25 mg total) by mouth daily. 01/21/16   Preston Fleeting, MD  ibuprofen (ADVIL,MOTRIN) 800 MG tablet Take 1 tablet (800 mg total) by mouth every 8 (eight) hours as needed. Patient not taking: Reported on 08/10/2015 02/20/15   Carlene Coria, PA-C  lisinopril (PRINIVIL,ZESTRIL) 10 MG tablet Take 1 tablet (10 mg total) by mouth daily. 01/21/16   Preston Fleeting, MD  naproxen (NAPROSYN) 500 MG tablet Take 1 tablet (500 mg total) by mouth 2 (two) times daily with a meal. 07/27/17   Joni Reining, PA-C  oxyCODONE-acetaminophen (PERCOCET) 7.5-325 MG tablet Take 1 tablet by mouth every 6 (six) hours as needed for severe pain. 07/27/17   Joni Reining, PA-C  sulfamethoxazole-trimethoprim (BACTRIM DS,SEPTRA DS) 800-160 MG tablet Take 1 tablet by mouth 2  (two) times daily. 07/27/17   Joni Reining, PA-C  traMADol (ULTRAM) 50 MG tablet Take 1 tablet (50 mg total) by mouth every 6 (six) hours as needed. Patient not taking: Reported on 08/10/2015 07/12/14   Francee Piccolo, PA-C    Allergies Patient has no known allergies.  Family History  Problem Relation Age of Onset  . Stroke Mother   . Diabetes type II Mother     Social History Social History   Tobacco Use  . Smoking status: Current Every Day Smoker    Packs/day: 1.50    Years: 15.00    Pack years: 22.50    Types: Cigarettes  . Smokeless tobacco: Never Used  Substance Use Topics  . Alcohol use: Yes    Comment: Occassional Use  . Drug use: Yes    Frequency: 4.0 times per week    Types: Marijuana    Review of Systems Constitutional: No fever/chills Eyes: No visual changes. ENT: No sore throat. Cardiovascular: Denies chest pain. Respiratory: Denies shortness of breath. Gastrointestinal: No abdominal pain.  No nausea, no vomiting.  No diarrhea.  No constipation. Genitourinary: Negative for dysuria. Musculoskeletal: Negative for back pain. Skin: Negative for rash. Neurological: Negative for headaches, focal weakness or numbness. Endocrine:Hypertension ____________________________________________   PHYSICAL EXAM:  VITAL SIGNS: ED Triage Vitals  Enc Vitals Group     BP 07/27/17 0752 (S) (!) 176/101     Pulse Rate 07/27/17 0752 60     Resp 07/27/17 0752 16     Temp  07/27/17 0752 98.3 F (36.8 C)     Temp Source 07/27/17 0752 Oral     SpO2 07/27/17 0752 100 %     Weight 07/27/17 0753 235 lb (106.6 kg)     Height 07/27/17 0753 6' 3.5" (1.918 m)     Head Circumference --      Peak Flow --      Pain Score 07/27/17 0753 9     Pain Loc --      Pain Edu? --      Excl. in GC? --    Constitutional: Alert and oriented. Well appearing and in no acute distress. Cardiovascular: Normal rate, regular rhythm. Grossly normal heart sounds.  Good peripheral circulation.   Elevated blood pressure Respiratory: Normal respiratory effort.  No retractions. Lungs CTAB. Gastrointestinal: Soft and nontender. No distention. No abdominal bruits. No CVA tenderness. Musculoskeletal: No lower extremity tenderness nor edema.  No joint effusions. Neurologic:  Normal speech and language. No gross focal neurologic deficits are appreciated. No gait instability. Skin:  Skin is warm, dry and intact. No rash noted.  Nonfluctuant nodule lesion left buttocks. Psychiatric: Mood and affect are normal. Speech and behavior are normal.  ____________________________________________   LABS (all labs ordered are listed, but only abnormal results are displayed)  Labs Reviewed - No data to display ____________________________________________  EKG   ____________________________________________  RADIOLOGY  ED MD interpretation:    Official radiology report(s): No results found.  ____________________________________________   PROCEDURES  Procedure(s) performed: None  Procedures  Critical Care performed: No  ____________________________________________   INITIAL IMPRESSION / ASSESSMENT AND PLAN / ED COURSE  As part of my medical decision making, I reviewed the following data within the electronic MEDICAL RECORD NUMBER    Buttocks pain secondary to abscess.  Discussed with patient rationale for not open drain at this time.  Patient given discharge care instruction advised take medication as directed.  Patient return back to ED if condition worsens.      ____________________________________________   FINAL CLINICAL IMPRESSION(S) / ED DIAGNOSES  Final diagnoses:  Abscess of buttock, left     ED Discharge Orders        Ordered    sulfamethoxazole-trimethoprim (BACTRIM DS,SEPTRA DS) 800-160 MG tablet  2 times daily     07/27/17 0833    oxyCODONE-acetaminophen (PERCOCET) 7.5-325 MG tablet  Every 6 hours PRN     07/27/17 0833    naproxen (NAPROSYN) 500 MG tablet   2 times daily with meals     07/27/17 1610       Note:  This document was prepared using Dragon voice recognition software and may include unintentional dictation errors.    Joni Reining, PA-C 07/27/17 9604    Schaevitz, Myra Rude, MD 07/28/17 1323

## 2017-07-27 NOTE — ED Triage Notes (Signed)
Pt to ED via POV c/o boil on his buttock. Pt states that it has been there for about 2 days. Pt unable to completely sit in triage. Pt appears uncomfortable.

## 2017-09-19 ENCOUNTER — Emergency Department (HOSPITAL_COMMUNITY)
Admission: EM | Admit: 2017-09-19 | Discharge: 2017-09-19 | Disposition: A | Discharge status: Home / Self Care | Payer: Self-pay | Attending: Emergency Medicine | Admitting: Emergency Medicine

## 2017-09-19 ENCOUNTER — Encounter (HOSPITAL_COMMUNITY): Payer: Self-pay | Admitting: *Deleted

## 2017-09-19 ENCOUNTER — Other Ambulatory Visit: Payer: Self-pay

## 2017-09-19 DIAGNOSIS — Z79899 Other long term (current) drug therapy: Secondary | ICD-10-CM | POA: Insufficient documentation

## 2017-09-19 DIAGNOSIS — L0231 Cutaneous abscess of buttock: Secondary | ICD-10-CM | POA: Insufficient documentation

## 2017-09-19 DIAGNOSIS — L0291 Cutaneous abscess, unspecified: Secondary | ICD-10-CM

## 2017-09-19 DIAGNOSIS — K0889 Other specified disorders of teeth and supporting structures: Secondary | ICD-10-CM

## 2017-09-19 DIAGNOSIS — Z86711 Personal history of pulmonary embolism: Secondary | ICD-10-CM | POA: Insufficient documentation

## 2017-09-19 DIAGNOSIS — I10 Essential (primary) hypertension: Secondary | ICD-10-CM | POA: Insufficient documentation

## 2017-09-19 DIAGNOSIS — F17210 Nicotine dependence, cigarettes, uncomplicated: Secondary | ICD-10-CM | POA: Insufficient documentation

## 2017-09-19 MED ORDER — LIDOCAINE-EPINEPHRINE 2 %-1:100000 IJ SOLN
20.0000 mL | Freq: Once | INTRAMUSCULAR | Status: DC
  Filled 2017-09-19: qty 20

## 2017-09-19 MED ORDER — IBUPROFEN 800 MG PO TABS
800.0000 mg | ORAL_TABLET | Freq: Once | ORAL | Status: AC
  Administered 2017-09-19: 800 mg via ORAL
  Filled 2017-09-19: qty 1

## 2017-09-19 MED ORDER — OXYCODONE HCL 5 MG PO TABS
5.0000 mg | ORAL_TABLET | Freq: Once | ORAL | Status: AC
  Administered 2017-09-19: 5 mg via ORAL
  Filled 2017-09-19: qty 1

## 2017-09-19 MED ORDER — HYDROCHLOROTHIAZIDE 25 MG PO TABS: 25.0000 mg | ORAL_TABLET | Freq: Every day | ORAL | 0 refills | Status: DC

## 2017-09-19 MED ORDER — CLINDAMYCIN HCL 150 MG PO CAPS: 450.0000 mg | ORAL_CAPSULE | Freq: Four times a day (QID) | ORAL | 0 refills | Status: AC

## 2017-09-19 MED ORDER — LIDOCAINE-EPINEPHRINE (PF) 2 %-1:200000 IJ SOLN
10.0000 mL | Freq: Once | INTRAMUSCULAR | Status: AC
  Administered 2017-09-19: 10 mL
  Filled 2017-09-19: qty 20

## 2017-09-19 MED ORDER — ACETAMINOPHEN 500 MG PO TABS
1000.0000 mg | ORAL_TABLET | Freq: Once | ORAL | Status: AC
  Administered 2017-09-19: 1000 mg via ORAL
  Filled 2017-09-19: qty 2

## 2017-09-19 NOTE — ED Provider Notes (Addendum)
[facility] EMERGENCY DEPARTMENT Provider Note   CSN: [id number] Arrival date & time: 09/19/17  1851     History   Chief Complaint Chief Complaint  Patient presents with  . Dental Pain    HPI Alec Grant is a 42 y.o. male.  42 yo M with a cc of dental pain.  Going on for the past week or so.  Denies injury, worse with palpation.  Pain as well to a spot on his left buttock.  Going on for the past couple of days.  Recurrent.  Denies fevers, chills.    The history is provided by the patient.  Dental Pain   This is a new problem. The current episode started more than 2 days ago. The problem occurs constantly. The problem has been gradually worsening. The pain is at a severity of 3/10. The pain is mild. He has tried nothing for the symptoms. The treatment provided no relief.    Past Medical History:  Diagnosis Date  . Chronic bronchitis    pt doesnt remember date  . Hypertension   . Migraines   . Pneumonia   . Pulmonary embolism [facility])     Patient Active Problem List   Diagnosis Date Noted  . Migraines   . External hemorrhoid, thrombosed 05/21/2011  . HTN (hypertension), malignant 03/09/2011  . Pneumonia 03/08/2011    History reviewed. No pertinent surgical history.      Home Medications    Prior to Admission medications   Medication Sig Start Date End Date Taking? Authorizing Provider  clindamycin (CLEOCIN) 150 MG capsule Take 3 capsules (450 mg total) by mouth 4 (four) times daily for 7 days. X 7 days 09/19/17 09/26/17  Melene Plan, DO  hydrochlorothiazide (HYDRODIURIL) 25 MG tablet Take 1 tablet (25 mg total) by mouth daily. 01/21/16   Preston Fleeting, MD  ibuprofen (ADVIL,MOTRIN) 800 MG tablet Take 1 tablet (800 mg total) by mouth every 8 (eight) hours as needed. Patient not taking: Reported on 08/10/2015 02/20/15   Carlene Coria, PA-C  lisinopril (PRINIVIL,ZESTRIL) 10 MG tablet Take 1 tablet (10 mg total) by mouth daily. 01/21/16   Preston Fleeting, MD  naproxen (NAPROSYN) 500 MG tablet Take 1 tablet (500 mg total) by mouth 2 (two) times daily with a meal. 07/27/17   Joni Reining, PA-C  oxyCODONE-acetaminophen (PERCOCET) 7.5-325 MG tablet Take 1 tablet by mouth every 6 (six) hours as needed for severe pain. 07/27/17   Joni Reining, PA-C  sulfamethoxazole-trimethoprim (BACTRIM DS,SEPTRA DS) 800-160 MG tablet Take 1 tablet by mouth 2 (two) times daily. 07/27/17   Joni Reining, PA-C  traMADol (ULTRAM) 50 MG tablet Take 1 tablet (50 mg total) by mouth every 6 (six) hours as needed. Patient not taking: Reported on 08/10/2015 07/12/14   Francee Piccolo, PA-C    Family History Family History  Problem Relation Age of Onset  . Stroke Mother   . Diabetes type II Mother     Social History Social History   Tobacco Use  . Smoking status: Current Every Day Smoker    Packs/day: 1.50    Years: 15.00    Pack years: 22.50    Types: Cigarettes  . Smokeless tobacco: Never Used  Substance Use Topics  . Alcohol use: Yes    Comment: Occassional Use  . Drug use: Yes    Frequency: 4.0 times per week    Types: Marijuana     Allergies   Patient has no known allergies.  Review of Systems Review of Systems  Constitutional: Negative for chills and fever.  HENT: Positive for dental problem. Negative for congestion and facial swelling.   Eyes: Negative for discharge and visual disturbance.  Respiratory: Negative for shortness of breath.   Cardiovascular: Negative for chest pain and palpitations.  Gastrointestinal: Negative for abdominal pain, diarrhea and vomiting.  Musculoskeletal: Negative for arthralgias and myalgias.  Skin: Positive for wound. Negative for color change and rash.  Neurological: Negative for tremors, syncope and headaches.  Psychiatric/Behavioral: Negative for confusion and dysphoric mood.     Physical Exam Updated Vital Signs BP (!) 180/100 (BP Location: Right Arm)   Pulse 93   Temp 98.3 F (36.8 C)  (Oral)   Resp 16   Ht 6\' 3"  (1.905 m)   Wt 108.9 kg (240 lb)   SpO2 99%   BMI 30.00 kg/m   Physical Exam  Constitutional: He is oriented to person, place, and time. He appears well-developed and well-nourished.  HENT:  Head: Normocephalic and atraumatic.  Poor dentition   Stated pain to the posterior R upper molar, no noted tenderness to percussion, no edema, tolerating secretions without difficulty.   Eyes: Pupils are equal, round, and reactive to light. EOM are normal.  Neck: Normal range of motion. Neck supple. No JVD present.  Cardiovascular: Normal rate and regular rhythm. Exam reveals no gallop and no friction rub.  No murmur heard. Pulmonary/Chest: No respiratory distress. He has no wheezes.  Abdominal: He exhibits no distension and no mass. There is no tenderness. There is no rebound and no guarding.  Musculoskeletal: Normal range of motion.  Neurological: He is alert and oriented to person, place, and time.  Skin: No rash noted. No pallor.  Psychiatric: He has a normal mood and affect. His behavior is normal.  Nursing note and vitals reviewed.    ED Treatments / Results  Labs (all labs ordered are listed, but only abnormal results are displayed) Labs Reviewed - No data to display  EKG None  Radiology No results found.  Procedures .Marland Kitchen.Incision and Drainage Date/Time: 09/19/2017 8:31 PM Performed by: Melene PlanFloyd, Jamice Carreno, DO Authorized by: Melene PlanFloyd, Zuhayr Deeney, DO   Consent:    Consent obtained:  Verbal   Consent given by:  Patient   Risks discussed:  Bleeding, incomplete drainage and infection   Alternatives discussed:  No treatment and delayed treatment Location:    Type:  Abscess   Location:  Lower extremity   Lower extremity location:  Buttock   Buttock location:  L buttock Pre-procedure details:    Skin preparation:  Chloraprep Anesthesia (see MAR for exact dosages):    Anesthesia method:  Local infiltration   Local anesthetic:  Lidocaine 2% WITH epi Procedure type:      Complexity:  Complex Procedure details:    Incision types:  Single straight   Incision depth:  Subcutaneous   Scalpel blade:  11   Wound management:  Probed and deloculated   Drainage:  Bloody and purulent   Drainage amount:  Moderate   Wound treatment:  Wound left open   Packing materials:  None Post-procedure details:    Patient tolerance of procedure:  Tolerated well, no immediate complications   (including critical care time)  Medications Ordered in ED Medications  acetaminophen (TYLENOL) tablet 1,000 mg (1,000 mg Oral Given 09/19/17 2017)  ibuprofen (ADVIL,MOTRIN) tablet 800 mg (800 mg Oral Given 09/19/17 2017)  oxyCODONE (Oxy IR/ROXICODONE) immediate release tablet 5 mg (5 mg Oral Given 09/19/17 2017)  lidocaine-EPINEPHrine (  XYLOCAINE W/EPI) 2 %-1:200000 (PF) injection 10 mL (10 mLs Other Given 09/19/17 2017)     Initial Impression / Assessment and Plan / ED Course  I have reviewed the triage vital signs and the nursing notes.  Pertinent labs & imaging results that were available during my care of the patient were reviewed by me and considered in my medical decision making (see chart for details).     42 yo M with a chief complaint of right upper dental pain.  There is no palpable abscess patient has no swelling I doubt that there is a significant infection here.  He also does not have much pain with percussion of the right posterior molar that he says hurts.  He does have a small abscess to the left buttock it is draining but I will perform an I&D.  Hypertensive here, and has been on prior visits.  No PCP.  Discussed need for PCP to manage his chronic issues, given script for hctz.   8:32 PM:  I have discussed the diagnosis/risks/treatment options with the patient and believe the pt to be eligible for discharge home to follow-up with PCP, Dentist. We also discussed returning to the ED immediately if new or worsening sx occur. We discussed the sx which are most concerning  (e.g., sudden worsening pain, fever, inability to tolerate by mouth) that necessitate immediate return. Medications administered to the patient during their visit and any new prescriptions provided to the patient are listed below.  Medications given during this visit Medications  acetaminophen (TYLENOL) tablet 1,000 mg (1,000 mg Oral Given 09/19/17 2017)  ibuprofen (ADVIL,MOTRIN) tablet 800 mg (800 mg Oral Given 09/19/17 2017)  oxyCODONE (Oxy IR/ROXICODONE) immediate release tablet 5 mg (5 mg Oral Given 09/19/17 2017)  lidocaine-EPINEPHrine (XYLOCAINE W/EPI) 2 %-1:200000 (PF) injection 10 mL (10 mLs Other Given 09/19/17 2017)      The patient appears reasonably screen and/or stabilized for discharge and I doubt any other medical condition or other [facility] requiring further screening, evaluation, or treatment in the ED at this time prior to discharge.    Final Clinical Impressions(s) / ED Diagnoses   Final diagnoses:  Pain, dental  Abscess    ED Discharge Orders        Ordered    clindamycin (CLEOCIN) 150 MG capsule  4 times daily     09/19/17 2029       Melene Plan, DO 09/19/17 2032    Melene Plan, DO 09/19/17 2037

## 2017-09-19 NOTE — ED Triage Notes (Signed)
The pt is c/o a toothache since Saturday and he thinks he has an abscess in his mouth also

## 2017-09-19 NOTE — Discharge Instructions (Signed)
Take 4 over the counter ibuprofen tablets 3 times a day or 2 over-the-counter naproxen tablets twice a day for pain. Also take tylenol 1000mg (2 extra strength) four times a day.    Follow-up with a dentist.  Have someone see this abscess in 48 hours.  Return sooner if you become systemically sick, fevers nausea or vomiting.  Return as well as pain suddenly worsens.

## 2017-09-27 ENCOUNTER — Emergency Department (HOSPITAL_COMMUNITY)
Admission: EM | Admit: 2017-09-27 | Discharge: 2017-09-27 | Disposition: A | Discharge status: Home / Self Care | Payer: Self-pay | Attending: Emergency Medicine | Admitting: Emergency Medicine

## 2017-09-27 ENCOUNTER — Encounter (HOSPITAL_COMMUNITY): Payer: Self-pay | Admitting: Emergency Medicine

## 2017-09-27 DIAGNOSIS — Y93H3 Activity, building and construction: Secondary | ICD-10-CM | POA: Insufficient documentation

## 2017-09-27 DIAGNOSIS — Z86711 Personal history of pulmonary embolism: Secondary | ICD-10-CM | POA: Insufficient documentation

## 2017-09-27 DIAGNOSIS — F17210 Nicotine dependence, cigarettes, uncomplicated: Secondary | ICD-10-CM | POA: Insufficient documentation

## 2017-09-27 DIAGNOSIS — X500XXA Overexertion from strenuous movement or load, initial encounter: Secondary | ICD-10-CM | POA: Insufficient documentation

## 2017-09-27 DIAGNOSIS — Z9119 Patient's noncompliance with other medical treatment and regimen: Secondary | ICD-10-CM | POA: Insufficient documentation

## 2017-09-27 DIAGNOSIS — Y990 Civilian activity done for income or pay: Secondary | ICD-10-CM | POA: Insufficient documentation

## 2017-09-27 DIAGNOSIS — Z79899 Other long term (current) drug therapy: Secondary | ICD-10-CM | POA: Insufficient documentation

## 2017-09-27 DIAGNOSIS — S39012A Strain of muscle, fascia and tendon of lower back, initial encounter: Secondary | ICD-10-CM | POA: Insufficient documentation

## 2017-09-27 DIAGNOSIS — Y9261 Building [any] under construction as the place of occurrence of the external cause: Secondary | ICD-10-CM | POA: Insufficient documentation

## 2017-09-27 DIAGNOSIS — I10 Essential (primary) hypertension: Secondary | ICD-10-CM | POA: Insufficient documentation

## 2017-09-27 MED ORDER — CYCLOBENZAPRINE HCL 10 MG PO TABS: 10.0000 mg | ORAL_TABLET | Freq: Two times a day (BID) | ORAL | 0 refills | Status: DC | PRN

## 2017-09-27 MED ORDER — OXYCODONE-ACETAMINOPHEN 5-325 MG PO TABS
1.0000 | ORAL_TABLET | Freq: Once | ORAL | Status: AC
  Administered 2017-09-27: 1 via ORAL
  Filled 2017-09-27: qty 1

## 2017-09-27 MED ORDER — PREDNISONE 20 MG PO TABS
50.0000 mg | ORAL_TABLET | Freq: Once | ORAL | Status: AC
  Administered 2017-09-27: 50 mg via ORAL
  Filled 2017-09-27: qty 3

## 2017-09-27 MED ORDER — ACETAMINOPHEN ER 650 MG PO TBCR: 650.0000 mg | EXTENDED_RELEASE_TABLET | Freq: Three times a day (TID) | ORAL | 0 refills | Status: AC | PRN

## 2017-09-27 MED ORDER — PREDNISONE 20 MG PO TABS: 40.0000 mg | ORAL_TABLET | Freq: Every day | ORAL | 0 refills | Status: AC

## 2017-09-27 NOTE — Discharge Instructions (Signed)
Your blood pressure was elevated in the ER today, please take the blood pressure medicine prescribed to you (HCTZ).  Follow-up with a regular doctor, I have listed information to [facility].  They are a clinic located across the street who except patients that do not have insurance.  Please take steroid medication for the next 4 days, you by your first dose in the ER today start until tomorrow.  I have also written you prescription for muscle relaxer medicine.  This medicine can make you drowsy so please do not drive.  He can apply heat to the lower back to help with your symptoms.  Please no strenuous activity until your symptoms are improving.  I have attached a work note.  Return to the or concerning symptoms like loss of bowel or bladder control, fever, numbness, weakness, chest pain, shortness of breath trouble with your vision, headache.

## 2017-09-27 NOTE — ED Triage Notes (Signed)
Patient complains of back pain that occurred during work this morning. Pain exacerbated by rotation of his torso. Denies other complaints.

## 2017-09-27 NOTE — ED Provider Notes (Signed)
[facility] EMERGENCY DEPARTMENT Provider Note   CSN: [id number] Arrival date & time: 09/27/17  0908     History   Chief Complaint Chief Complaint  Patient presents with  . Back Pain    HPI Ausar Georgiou is a 42 y.o. male.  HPI   Clois Treanor is a 42yo male with a history of hypertension and chronic bronchitis who presents to the emergency department for evaluation of right lower back pain.  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  He reports that he had 10/10 severity right-sided "sharp and burning" back pain.  Pain does not radiate.  It is constant, but worsens with twisting or bending at the hip or with any movement whatsoever.  Pain is improved with sitting still.  He has not taken any over-the-counter medications for his symptoms, took the bus directly here.  Denies prior back problems or surgeries.  Denies history of IV drug use or personal history of cancer.  Denies fevers, chills, unexpected weight changes, night sweats, abdominal pain, nausea/vomiting, dysuria, urinary frequency, loss of bowel or bladder control, numbness, weakness.  He can ambulate independently despite pain.  States that he was given HCTZ last time he was in the ER for hypertension, but has not taken it for several days because he does not like to take medicines.  He has not establish care with a primary doctor.  Smoked a cigarette prior to coming into the ER today.  He denies chest pain, short of breath, visual disturbance, headache, reduced urination, swelling.  Past Medical History:  Diagnosis Date  . Chronic bronchitis    pt doesnt remember date  . Hypertension   . Migraines   . Pneumonia   . Pulmonary embolism [facility])     Patient Active Problem List   Diagnosis Date Noted  . Migraines   . External hemorrhoid, thrombosed 05/21/2011  . HTN  (hypertension), malignant 03/09/2011  . Pneumonia 03/08/2011    History reviewed. No pertinent surgical history.      Home Medications    Prior to Admission medications   Medication Sig Start Date End Date Taking? Authorizing Provider  hydrochlorothiazide (HYDRODIURIL) 25 MG tablet Take 1 tablet (25 mg total) by mouth daily. 09/19/17   Melene Plan, DO  ibuprofen (ADVIL,MOTRIN) 800 MG tablet Take 1 tablet (800 mg total) by mouth every 8 (eight) hours as needed. Patient not taking: Reported on 08/10/2015 02/20/15   Carlene Coria, PA-C  lisinopril (PRINIVIL,ZESTRIL) 10 MG tablet Take 1 tablet (10 mg total) by mouth daily. 01/21/16   Preston Fleeting, MD  naproxen (NAPROSYN) 500 MG tablet Take 1 tablet (500 mg total) by mouth 2 (two) times daily with a meal. 07/27/17   Joni Reining, PA-C  oxyCODONE-acetaminophen (PERCOCET) 7.5-325 MG tablet Take 1 tablet by mouth every 6 (six) hours as needed for severe pain. 07/27/17   Joni Reining, PA-C  sulfamethoxazole-trimethoprim (BACTRIM DS,SEPTRA DS) 800-160 MG tablet Take 1 tablet by mouth 2 (two) times daily. 07/27/17   Joni Reining, PA-C  traMADol (ULTRAM) 50 MG tablet Take 1 tablet (50 mg total) by mouth every 6 (six) hours as needed. Patient not taking: Reported on 08/10/2015 07/12/14   Francee Piccolo, PA-C    Family History Family History  Problem Relation Age of Onset  . Stroke Mother   . Diabetes type II Mother  Social History Social History   Tobacco Use  . Smoking status: Current Every Day Smoker    Packs/day: 1.50    Years: 15.00    Pack years: 22.50    Types: Cigarettes  . Smokeless tobacco: Never Used  Substance Use Topics  . Alcohol use: Yes    Comment: Occassional Use  . Drug use: Yes    Frequency: 4.0 times per week    Types: Marijuana     Allergies   Patient has no known allergies.   Review of Systems Review of Systems  Constitutional: Negative for chills and fever.  Eyes: Negative for visual  disturbance.  Respiratory: Negative for shortness of breath.   Cardiovascular: Negative for chest pain and leg swelling.  Gastrointestinal: Negative for abdominal pain, nausea and vomiting.  Genitourinary: Negative for difficulty urinating, dysuria, flank pain and frequency.  Musculoskeletal: Positive for back pain. Negative for arthralgias, gait problem and joint swelling.  Skin: Negative for rash and wound.  Neurological: Negative for weakness, light-headedness, numbness and headaches.  Psychiatric/Behavioral: Negative for agitation.     Physical Exam Updated Vital Signs BP (!) 146/112 (BP Location: Left Arm)   Pulse 65   Temp (!) 97.5 F (36.4 C) (Oral)   Resp 16   SpO2 98%   Physical Exam  Constitutional: He is oriented to person, place, and time. He appears well-developed and well-nourished. No distress.  Sitting up in no apparent distress, nontoxic-appearing.  HENT:  Head: Normocephalic and atraumatic.  Eyes: Right eye exhibits no discharge. Left eye exhibits no discharge.  Pulmonary/Chest: Effort normal. No respiratory distress.  Abdominal:  Soft and nondistended.  No pulsatile mass.  Bowel sounds normoactive in all 4 quadrants.  Nontender to palpation.  Musculoskeletal:  Tender to palpation over right-sided paraspinal muscles of the lumbar spine.  No overlying rash or bruising.  No midline T-spine or L-spine tenderness.  Strength 5/5 in bilateral knee flexion/extension and ankle dorsiflexion/plantarflexion.  DP pulses 2+ and symmetric bilaterally.  Neurological: He is alert and oriented to person, place, and time. Coordination normal.  Distal sensation to light touch intact in bilateral lower extremities.  Gait normal and coordination and balance.  Skin: Skin is warm and dry. He is not diaphoretic.  Psychiatric: He has a normal mood and affect. His behavior is normal.  Nursing note and vitals reviewed.    ED Treatments / Results  Labs (all labs ordered are listed,  but only abnormal results are displayed) Labs Reviewed - No data to display  EKG None  Radiology No results found.  Procedures Procedures (including critical care time)  Medications Ordered in ED Medications  oxyCODONE-acetaminophen (PERCOCET/ROXICET) 5-325 MG per tablet 1 tablet (has no administration in time range)  predniSONE (DELTASONE) tablet 50 mg (has no administration in time range)     Initial Impression / Assessment and Plan / ED Course  I have reviewed the triage vital signs and the nursing notes.  Pertinent labs & imaging results that were available during my care of the patient were reviewed by me and considered in my medical decision making (see chart for details).    Patient with lumbar back strain after twisting today to pick something at work. No neurological deficits and normal neuro exam.  Patient can walk independently. No loss of bowel or bladder control. No concern for cauda equina.  No fever, night sweats, weight loss, h/o cancer, IVDU.  Do not suspect epidural abscess, discitis or osteomyelitis.  His blood pressure is elevated in the  ER today, this is consistent with prior visits.  No chest pain, shortness of breath, visual disturbance, headache, swelling or urinary symptoms and I do not suspect hypertensive emergency. His hypertension likely related to pain and he also reports smoking a cigarette prior to being seen which may be contributing.  He has been discharged with HCTZ past, I have counseled him to start taking. I have also given him information to establish care with PCP discussed the effects of long-term hypertension and the importance of managing this. No hypotension, pulsatile mass, patient non-toxic and do not suspect AAA given symptoms. RICE protocol and pain medicine indicated and discussed with patient. Patient counseled on return precautions and he agrees.   Final Clinical Impressions(s) / ED Diagnoses   Final diagnoses:  Strain of lumbar region,  initial encounter    ED Discharge Orders        Ordered    cyclobenzaprine (FLEXERIL) 10 MG tablet  2 times daily PRN     09/27/17 1025    acetaminophen (TYLENOL 8 HOUR) 650 MG CR tablet  Every 8 hours PRN     09/27/17 1025    predniSONE (DELTASONE) 20 MG tablet  Daily     09/27/17 1025       Lawrence MarseillesShrosbree, Jase Reep J, PA-C 09/27/17 1724    Jacalyn LefevreHaviland, Julie, MD 09/29/17 1339

## 2018-01-11 ENCOUNTER — Encounter (HOSPITAL_COMMUNITY): Payer: Self-pay | Admitting: Emergency Medicine

## 2018-01-11 ENCOUNTER — Emergency Department (HOSPITAL_COMMUNITY)
Admission: EM | Admit: 2018-01-11 | Discharge: 2018-01-11 | Disposition: A | Discharge status: Home / Self Care | Payer: Self-pay | Attending: Emergency Medicine | Admitting: Emergency Medicine

## 2018-01-11 DIAGNOSIS — F17210 Nicotine dependence, cigarettes, uncomplicated: Secondary | ICD-10-CM | POA: Insufficient documentation

## 2018-01-11 DIAGNOSIS — I10 Essential (primary) hypertension: Secondary | ICD-10-CM | POA: Insufficient documentation

## 2018-01-11 DIAGNOSIS — Z79899 Other long term (current) drug therapy: Secondary | ICD-10-CM | POA: Insufficient documentation

## 2018-01-11 DIAGNOSIS — L03317 Cellulitis of buttock: Secondary | ICD-10-CM | POA: Insufficient documentation

## 2018-01-11 MED ORDER — IBUPROFEN 600 MG PO TABS: 600.0000 mg | ORAL_TABLET | Freq: Four times a day (QID) | ORAL | 0 refills | Status: DC | PRN

## 2018-01-11 MED ORDER — HYDROCODONE-ACETAMINOPHEN 5-325 MG PO TABS: 1.0000 | ORAL_TABLET | Freq: Four times a day (QID) | ORAL | 0 refills | Status: DC | PRN

## 2018-01-11 MED ORDER — SULFAMETHOXAZOLE-TRIMETHOPRIM 800-160 MG PO TABS: 1.0000 | ORAL_TABLET | Freq: Two times a day (BID) | ORAL | 0 refills | Status: AC

## 2018-01-11 MED ORDER — CEPHALEXIN 500 MG PO CAPS
500.0000 mg | ORAL_CAPSULE | Freq: Four times a day (QID) | ORAL | 0 refills | Status: DC
Start: 2018-01-11 — End: 2019-07-20

## 2018-01-11 NOTE — Discharge Instructions (Addendum)
Please take all of your antibiotics until finished!   You may develop abdominal discomfort or diarrhea from the antibiotic.  You may help offset this with probiotics which you can buy or get in yogurt. Do not eat or take the probiotics until 2 hours after your antibiotic.  Wound Care - After I&D of Abscess  The only reason to replace the bandage is to protect clothing from drainage. Bandages, if used, should be replaced daily or whenever soiled. The wound may continue to drain for the next 2-3 days.   Clean the wound and surrounding area gently with tap water and mild soap. Rinse well and blot dry. You may shower normally. Soaking the wound in Epsom salt baths for no more than 15 minutes once a day may help rinse out any remaining pus and help with wound healing.  Clean the wound daily to prevent further infection. Do not use cleaners such as hydrogen peroxide or alcohol.   Scar reduction: Application of a topical antibiotic ointment, such as Neosporin, after the wound has begun to close and heal well can decrease scab formation and reduce scarring. After the wound has healed, application of ointments such as Aquaphor can also reduce scar formation.  The key to scar reduction is keeping the skin well hydrated and supple. Drinking plenty of water throughout the day (At least eight 8oz glasses of water a day) is essential to staying well hydrated.  Sun exposure: Keep the wound out of the sun. After the wound has healed, continue to protect it from the sun by wearing protective clothing or applying sunscreen.  Pain: You may use Tylenol, naproxen, or ibuprofen for pain.  Antiinflammatory medications: Take 600 mg of ibuprofen every 6 hours or 440 mg (over the counter dose) to 500 mg (prescription dose) of naproxen every 12 hours for the next 3 days. After this time, these medications may be used as needed for pain. Take these medications with food to avoid upset stomach. Choose only one of these  medications, do not take them together. Acetaminophen (generic for Tylenol): Should you continue to have additional pain while taking the ibuprofen or naproxen, you may add in acetaminophen as needed. Your daily total maximum amount of acetaminophen from all sources should be limited to 4000mg /day for persons without liver problems, or 2000mg /day for those with liver problems. Vicodin: May take Vicodin (hydrocodone-acetaminophen) as needed for severe pain.  Do not drive or perform other dangerous activities while taking the Vicodin.  Please note that each pill of Vicodin contains 325 mg of acetaminophen (Tylenol) and the above dosage limits apply.  Follow up: Please follow-up with general surgery due to the recurrence of this issue.  Return to the ED sooner should signs of worsening infection arise, such as spreading redness, worsening puffiness/swelling, severe increase in pain, fever over 100.58F, or any other major issues.  For prescription assistance, may try using prescription discount sites or apps, such as goodrx.com

## 2018-01-11 NOTE — ED Provider Notes (Signed)
[facility] EMERGENCY DEPARTMENT Provider Note   CSN: [id number] Arrival date & time: 01/11/18  0845     History   Chief Complaint Chief Complaint  Patient presents with  . Abscess    HPI Alec Grant is a 42 y.o. male.  HPI   Alec Grant is a 42 y.o. male, with a history of HTN, presenting to the ED with an abscess to left buttock for the last week.  States it began to drain a few days ago.  Pain is sharp, severe, nonradiating from the left buttock.  He has not taken any indications or tried any therapies for his complaint.  He states this is the third recurrence of this issue this year with the last episode occurring in May 2019. He denies fever/chills, abdominal pain, N/V/C/D, abnormal discharge from the rectum, or any other complaints.   Past Medical History:  Diagnosis Date  . Chronic bronchitis    pt doesnt remember date  . Hypertension   . Migraines   . Pneumonia   . Pulmonary embolism [facility]     Patient Active Problem List   Diagnosis Date Noted  . Migraines   . External hemorrhoid, thrombosed 05/21/2011  . HTN (hypertension), malignant 03/09/2011  . Pneumonia 03/08/2011    No past surgical history on file.      Home Medications    Prior to Admission medications   Medication Sig Start Date End Date Taking? Authorizing Provider  acetaminophen (TYLENOL 8 HOUR) 650 MG CR tablet Take 1 tablet (650 mg total) by mouth every 8 (eight) hours as needed for pain. 09/27/17   Kellie Shropshire, PA-C  cephALEXin (KEFLEX) 500 MG capsule Take 1 capsule (500 mg total) by mouth 4 (four) times daily. 01/11/18   Angelli Baruch C, PA-C  cyclobenzaprine (FLEXERIL) 10 MG tablet Take 1 tablet (10 mg total) by mouth 2 (two) times daily as needed for muscle spasms. 09/27/17   Kellie Shropshire, PA-C  hydrochlorothiazide (HYDRODIURIL) 25 MG tablet Take 1 tablet (25 mg total) by mouth daily. 09/19/17   Melene Plan, DO  HYDROcodone-acetaminophen  (NORCO/VICODIN) 5-325 MG tablet Take 1 tablet by mouth every 6 (six) hours as needed for severe pain. 01/11/18   Cyrena Kuchenbecker C, PA-C  ibuprofen (ADVIL,MOTRIN) 600 MG tablet Take 1 tablet (600 mg total) by mouth every 6 (six) hours as needed. 01/11/18   Randal Yepiz C, PA-C  lisinopril (PRINIVIL,ZESTRIL) 10 MG tablet Take 1 tablet (10 mg total) by mouth daily. 01/21/16   Preston Fleeting, MD  sulfamethoxazole-trimethoprim (BACTRIM DS,SEPTRA DS) 800-160 MG tablet Take 1 tablet by mouth 2 (two) times daily for 7 days. 01/11/18 01/18/18  Anselm Pancoast, PA-C    Family History Family History  Problem Relation Age of Onset  . Stroke Mother   . Diabetes type II Mother     Social History Social History   Tobacco Use  . Smoking status: Current Every Day Smoker    Packs/day: 1.50    Years: 15.00    Pack years: 22.50    Types: Cigarettes  . Smokeless tobacco: Never Used  Substance Use Topics  . Alcohol use: Yes    Comment: Occassional Use  . Drug use: Yes    Frequency: 4.0 times per week    Types: Marijuana     Allergies   Patient has no known allergies.   Review of Systems Review of Systems  Constitutional: Negative for fever.  Gastrointestinal: Negative for abdominal pain, constipation, diarrhea and  nausea.  Musculoskeletal: Negative for back pain.  Skin: Positive for color change.       Abscess to left buttock  Neurological: Negative for weakness and numbness.  All other systems reviewed and are negative.    Physical Exam Updated Vital Signs BP (!) 163/102   Pulse (!) 52   Temp 97.8 F (36.6 C) (Oral)   Resp 19   Ht 6\' 3"  (1.905 m)   Wt 108.9 kg   SpO2 99%   BMI 30.00 kg/m   Physical Exam  Constitutional: He appears well-developed and well-nourished. No distress.  HENT:  Head: Normocephalic and atraumatic.  Eyes: Conjunctivae are normal.  Neck: Neck supple.  Cardiovascular: Normal rate, regular rhythm and intact distal pulses.  Pulmonary/Chest: Effort normal. No  respiratory distress.  Abdominal: Soft. There is no tenderness. There is no guarding.  Musculoskeletal: He exhibits no edema.  Lymphadenopathy:    He has no cervical adenopathy.  Neurological: He is alert.  Skin: Skin is warm and dry. He is not diaphoretic.  There is a approximately 0.5 cm wound to the inner left buttock.  A small amount of drainage that appears serosanguineous.  He has surrounding tenderness with some induration in the immediate area of the wound.  Some increased warmth as well.  No noted fluctuance.  Psychiatric: He has a normal mood and affect. His behavior is normal.  Nursing note and vitals reviewed.    ED Treatments / Results  Labs (all labs ordered are listed, but only abnormal results are displayed) Labs Reviewed - No data to display  EKG None  Radiology No results found.  Procedures [place] bedside Date/Time: 01/11/2018 10:05 AM Performed by: Anselm Pancoast, PA-C Authorized by: Anselm Pancoast, PA-C  Consent: Verbal consent obtained. Risks and benefits: risks, benefits and alternatives were discussed Consent given by: patient Patient identity confirmed: verbally with patient Local anesthesia used: no  Anesthesia: Local anesthesia used: no  Sedation: Patient sedated: no  Patient tolerance: Patient tolerated the procedure well with no immediate complications    (including critical care time)  EMERGENCY DEPARTMENT US SOFT TISSUE INTERPRETATION "Study: Limited Soft Tissue Ultrasound"  INDICATIONS: Pain and Soft tissue infection Multiple views of the body part were obtained in real-time with a multi-frequency linear probe  PERFORMED BY: Myself IMAGES ARCHIVED?: Yes SIDE:Left BODY PART:Buttock INTERPRETATION:  Cellulitis present    Medications Ordered in ED Medications - No data to display   Initial Impression / Assessment and Plan / ED Course  I have reviewed the triage vital signs and the nursing notes.  Pertinent labs & imaging results  that were available during my care of the patient were reviewed by me and considered in my medical decision making (see chart for details).     Patient presents for evaluation of suspected abscess.  He does not have evidence of a central fluid collection on ultrasound, however, he does have ultrasound evidence of cellulitis.  Since the area has been draining, I suspect his abscess has been draining, but cellulitis remains.  Due to the multiple recurrences of this issue over the past year, referral to general surgery is indicated. The patient was given instructions for home care as well as return precautions. Patient voices understanding of these instructions, accepts the plan, and is comfortable with discharge.  Final Clinical Impressions(s) / ED Diagnoses   Final diagnoses:  Cellulitis of buttock    ED Discharge Orders         Ordered    cephALEXin (  KEFLEX) 500 MG capsule  4 times daily     01/11/18 1019    sulfamethoxazole-trimethoprim (BACTRIM DS,SEPTRA DS) 800-160 MG tablet  2 times daily     01/11/18 1019    HYDROcodone-acetaminophen (NORCO/VICODIN) 5-325 MG tablet  Every 6 hours PRN     01/11/18 1019    ibuprofen (ADVIL,MOTRIN) 600 MG tablet  Every 6 hours PRN     01/11/18 1019           Anselm Pancoast, PA-C 01/11/18 1032    Gwyneth Sprout, MD 01/11/18 1544

## 2018-01-11 NOTE — ED Notes (Signed)
Pt discharged from ED; instructions provided and scripts given; Pt encouraged to return to ED if symptoms worsen and to f/u with PCP; Pt verbalized understanding of all instructions 

## 2018-01-11 NOTE — ED Triage Notes (Signed)
Pt has a large abscess to right buttocks/center buttocks area. He has has this for 1 week but has had this in the same location twice in the past.

## 2018-01-23 ENCOUNTER — Emergency Department (HOSPITAL_COMMUNITY)
Admission: EM | Admit: 2018-01-23 | Discharge: 2018-01-23 | Disposition: A | Discharge status: Home / Self Care | Payer: Self-pay | Attending: Emergency Medicine | Admitting: Emergency Medicine

## 2018-01-23 ENCOUNTER — Encounter (HOSPITAL_COMMUNITY): Payer: Self-pay | Admitting: Emergency Medicine

## 2018-01-23 DIAGNOSIS — Y929 Unspecified place or not applicable: Secondary | ICD-10-CM | POA: Insufficient documentation

## 2018-01-23 DIAGNOSIS — H1131 Conjunctival hemorrhage, right eye: Secondary | ICD-10-CM | POA: Insufficient documentation

## 2018-01-23 DIAGNOSIS — Y9389 Activity, other specified: Secondary | ICD-10-CM | POA: Insufficient documentation

## 2018-01-23 DIAGNOSIS — F17210 Nicotine dependence, cigarettes, uncomplicated: Secondary | ICD-10-CM | POA: Insufficient documentation

## 2018-01-23 DIAGNOSIS — X58XXXA Exposure to other specified factors, initial encounter: Secondary | ICD-10-CM | POA: Insufficient documentation

## 2018-01-23 DIAGNOSIS — I10 Essential (primary) hypertension: Secondary | ICD-10-CM | POA: Insufficient documentation

## 2018-01-23 DIAGNOSIS — Y998 Other external cause status: Secondary | ICD-10-CM | POA: Insufficient documentation

## 2018-01-23 MED ORDER — FLUORESCEIN SODIUM 1 MG OP STRP
1.0000 | ORAL_STRIP | Freq: Once | OPHTHALMIC | Status: AC
  Administered 2018-01-23: 1 via OPHTHALMIC
  Filled 2018-01-23: qty 1

## 2018-01-23 MED ORDER — TETRACAINE HCL 0.5 % OP SOLN
2.0000 [drp] | Freq: Once | OPHTHALMIC | Status: AC
  Administered 2018-01-23: 2 [drp] via OPHTHALMIC
  Filled 2018-01-23: qty 4

## 2018-01-23 NOTE — ED Triage Notes (Signed)
Pt presents to ED for assessment of hemorrhage in his right eye.  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  Pt denies pain, denies changes in vision.    Of note:  Pt BP on arrival 194/118, states hx of HTN but currently not taking any medications.

## 2018-01-23 NOTE — ED Notes (Signed)
All eye assessment tools at bedside for provider

## 2018-01-23 NOTE — ED Notes (Signed)
Patient able to ambulate independently  

## 2018-01-23 NOTE — ED Notes (Signed)
Right eye 10/63 Left eye 10/40 both eyes 10/25

## 2018-01-23 NOTE — ED Provider Notes (Signed)
[facility] Provider Note MRN:  [id number]  Arrival date & time: 01/23/18     Chief Complaint   Eye problem History of Present Illness   Alec Grant is a 42 y.o. year-old male with a history of hypertension presenting to the ED with chief complaint of eye problem.  Patient explains that he works in [employer] and thinks something hit him in the eye this past Monday.  Patient denies pain in the eye, no significant foreign body sensation.  No vision loss.  No other trauma.  Explains that he is noticed blood on the surface of the eye for the past 1 to 2 days is getting worse and spreading.  Thinks that the blood is causing some mild pressure sensation.  Symptoms are constant, mild.  Review of Systems  A complete 10 system review of systems was obtained and all systems are negative except as noted in the HPI and PMH.   Patient's Health History    Past Medical History:  Diagnosis Date  . Chronic bronchitis    pt doesnt remember date  . Hypertension   . Migraines   . Pneumonia   . Pulmonary embolism (HCC)     History reviewed. No pertinent surgical history.  Family History  Problem Relation Age of Onset  . Stroke Mother   . Diabetes type II Mother     Social History   Socioeconomic History  . Marital status: Single    Spouse name: Not on file  . Number of children: Not on file  . Years of education: Not on file  . Highest education level: Not on file  Occupational History  . Not on file  Social Needs  . Financial resource strain: Not on file  . Food insecurity:    Worry: Not on file    Inability: Not on file  . Transportation needs:    Medical: Not on file    Non-medical: Not on file  Tobacco Use  . Smoking status: Current Every Day Smoker    Packs/day: 1.50    Years: 15.00    Pack years: 22.50    Types: Cigarettes  . Smokeless tobacco: Never Used  Substance and Sexual Activity  . Alcohol use: Yes    Comment:  Occassional Use  . Drug use: Yes    Frequency: 4.0 times per week    Types: Marijuana  . Sexual activity: Not on file  Lifestyle  . Physical activity:    Days per week: Not on file    Minutes per session: Not on file  . Stress: Not on file  Relationships  . Social connections:    Talks on phone: Not on file    Gets together: Not on file    Attends religious service: Not on file    Active member of club or organization: Not on file    Attends meetings of clubs or organizations: Not on file    Relationship status: Not on file  . Intimate partner violence:    Fear of current or ex partner: Not on file    Emotionally abused: Not on file    Physically abused: Not on file    Forced sexual activity: Not on file  Other Topics Concern  . Not on file  Social History Narrative  . Not on file     Physical Exam  Vital Signs and Nursing Notes reviewed Vitals:   01/23/18 1853  BP: (!) 194/118  Pulse: (!) 58  Resp: 18  Temp: 98.2 F (36.8 C)  SpO2: 99%    CONSTITUTIONAL: Well-appearing, NAD NEURO:  Alert and oriented x 3, no focal deficits EYES:  eyes equal and reactive, normal extraocular movements, no corneal abrasion or ulcer, right medial subconjunctival hemorrhage ENT/NECK:  no LAD, no JVD CARDIO: Regular rate, well-perfused, normal S1 and S2 PULM:  CTAB no wheezing or rhonchi GI/GU:  normal bowel sounds, non-distended, non-tender MSK/SPINE:  No gross deformities, no edema SKIN:  no rash, atraumatic PSYCH:  Appropriate speech and behavior  Diagnostic and Interventional Summary    Labs Reviewed - No data to display  No orders to display    Medications  tetracaine (PONTOCAINE) 0.5 % ophthalmic solution 2 drop (has no administration in time range)  fluorescein ophthalmic strip 1 strip (has no administration in time range)     Procedures Critical Care  ED Course and Medical Decision Making  I have reviewed the triage vital signs and the nursing notes.  Pertinent  labs & imaging results that were available during my care of the patient were reviewed by me and considered in my medical decision making (see below for details).  Symptoms and exam consistent with isolated sub-conjunctival hemorrhage.  No evidence of corneal abrasion, corneal ulcer, nothing to suggest open globe or other emergent process.  Provided reassurance.  After the discussed management above, the patient was determined to be safe for discharge.  The patient was in agreement with this plan and all questions regarding their care were answered.  ED return precautions were discussed and the patient will return to the ED with any significant worsening of condition.   Elmer Sow. Pilar Plate, MD [facility] Emergency Medicine [facility] [email or web address]  Final Clinical Impressions(s) / ED Diagnoses     ICD-10-CM   1. Subconjunctival hemorrhage of right eye H11.31     ED Discharge Orders    None         Sabas Sous, MD 01/23/18 1940

## 2018-01-23 NOTE — Discharge Instructions (Addendum)
You were evaluated in the Emergency Department and after careful evaluation, we did not find any emergent condition requiring admission or further testing in the hospital.  Your symptoms today seem to be due to a subconjunctival hemorrhage.  This is not an emergency and will get better with time.  Please return to the Emergency Department if you experience any worsening of your condition.  We encourage you to follow up with a primary care provider.  Thank you for allowing us to be a part of your care.

## 2018-03-26 ENCOUNTER — Emergency Department (HOSPITAL_COMMUNITY): Payer: Self-pay

## 2018-03-26 ENCOUNTER — Other Ambulatory Visit: Payer: Self-pay

## 2018-03-26 ENCOUNTER — Emergency Department (HOSPITAL_COMMUNITY)
Admission: EM | Admit: 2018-03-26 | Discharge: 2018-03-26 | Disposition: A | Discharge status: Home / Self Care | Payer: Self-pay | Attending: Emergency Medicine | Admitting: Emergency Medicine

## 2018-03-26 ENCOUNTER — Encounter (HOSPITAL_COMMUNITY): Payer: Self-pay | Admitting: Emergency Medicine

## 2018-03-26 DIAGNOSIS — J181 Lobar pneumonia, unspecified organism: Secondary | ICD-10-CM | POA: Insufficient documentation

## 2018-03-26 DIAGNOSIS — J189 Pneumonia, unspecified organism: Secondary | ICD-10-CM

## 2018-03-26 DIAGNOSIS — Z86718 Personal history of other venous thrombosis and embolism: Secondary | ICD-10-CM | POA: Insufficient documentation

## 2018-03-26 DIAGNOSIS — I10 Essential (primary) hypertension: Secondary | ICD-10-CM | POA: Insufficient documentation

## 2018-03-26 DIAGNOSIS — Z79899 Other long term (current) drug therapy: Secondary | ICD-10-CM | POA: Insufficient documentation

## 2018-03-26 DIAGNOSIS — F17210 Nicotine dependence, cigarettes, uncomplicated: Secondary | ICD-10-CM | POA: Insufficient documentation

## 2018-03-26 MED ORDER — DOXYCYCLINE HYCLATE 100 MG PO TABS
100.0000 mg | ORAL_TABLET | Freq: Once | ORAL | Status: AC
  Administered 2018-03-26: 100 mg via ORAL
  Filled 2018-03-26: qty 1

## 2018-03-26 MED ORDER — DOXYCYCLINE HYCLATE 100 MG PO CAPS: 100.0000 mg | ORAL_CAPSULE | Freq: Two times a day (BID) | ORAL | 0 refills | Status: AC

## 2018-03-26 MED ORDER — IBUPROFEN 400 MG PO TABS
600.0000 mg | ORAL_TABLET | Freq: Once | ORAL | Status: AC
  Administered 2018-03-26: 600 mg via ORAL
  Filled 2018-03-26: qty 1

## 2018-03-26 NOTE — ED Provider Notes (Signed)
[facility] Provider Note   CSN: [id number] Arrival date & time: 03/26/18  1046     History   Chief Complaint Chief Complaint  Patient presents with  . Influenza    HPI Alec Grant is a 43 y.o. male w PMHx of PE, HTN, presenting to the ED with complaint of new onset of productive cough of green sputum that began yesterday. He has associated headache, nasal congestion, generalized body aches, fever, chills, nausea and diarrhea. Denies vomiting, abd pain. Positive sick contact at work. No influenza vaccine this season.  Patient states he took Alka-Seltzer cold and flu without much relief.  This feels very different than his history of pulmonary embolism.  This occurred he thinks 6 to 8 years ago, is not on anticoagulation.  The history is provided by the patient.    Past Medical History:  Diagnosis Date  . Chronic bronchitis    pt doesnt remember date  . Hypertension   . Migraines   . Pneumonia   . Pulmonary embolism [facility](HCC)     Patient Active Problem List   Diagnosis Date Noted  . Migraines   . External hemorrhoid, thrombosed 05/21/2011  . HTN (hypertension), malignant 03/09/2011  . Pneumonia 03/08/2011    History reviewed. No pertinent surgical history.      Home Medications    Prior to Admission medications   Medication Sig Start Date End Date Taking? Authorizing Provider  acetaminophen (TYLENOL 8 HOUR) 650 MG CR tablet Take 1 tablet (650 mg total) by mouth every 8 (eight) hours as needed for pain. 09/27/17   Kellie ShropshireShrosbree, Emily J, PA-C  cephALEXin (KEFLEX) 500 MG capsule Take 1 capsule (500 mg total) by mouth 4 (four) times daily. 01/11/18   Joy, Shawn C, PA-C  cyclobenzaprine (FLEXERIL) 10 MG tablet Take 1 tablet (10 mg total) by mouth 2 (two) times daily as needed for muscle spasms. 09/27/17   Kellie ShropshireShrosbree, Emily J, PA-C  doxycycline (VIBRAMYCIN) 100 MG capsule Take 1 capsule (100 mg total) by mouth 2 (two) times daily for 7  days. 03/26/18 04/02/18  Robinson, [place] N, PA-C  hydrochlorothiazide (HYDRODIURIL) 25 MG tablet Take 1 tablet (25 mg total) by mouth daily. 09/19/17   Melene PlanFloyd, Dan, DO  HYDROcodone-acetaminophen (NORCO/VICODIN) 5-325 MG tablet Take 1 tablet by mouth every 6 (six) hours as needed for severe pain. 01/11/18   Joy, Shawn C, PA-C  ibuprofen (ADVIL,MOTRIN) 600 MG tablet Take 1 tablet (600 mg total) by mouth every 6 (six) hours as needed. 01/11/18   Joy, Shawn C, PA-C  lisinopril (PRINIVIL,ZESTRIL) 10 MG tablet Take 1 tablet (10 mg total) by mouth daily. 01/21/16   Preston FleetingSchubert, Anna, MD    Family History Family History  Problem Relation Age of Onset  . Stroke Mother   . Diabetes type II Mother     Social History Social History   Tobacco Use  . Smoking status: Current Every Day Smoker    Packs/day: 1.50    Years: 15.00    Pack years: 22.50    Types: Cigarettes  . Smokeless tobacco: Never Used  Substance Use Topics  . Alcohol use: Yes    Comment: Occassional Use  . Drug use: Yes    Frequency: 4.0 times per week    Types: Marijuana     Allergies   Patient has no known allergies.   Review of Systems Review of Systems  Constitutional: Positive for chills and fever.  HENT: Positive for congestion. Negative for sore throat.  Respiratory: Positive for cough and chest tightness. Negative for shortness of breath.   Gastrointestinal: Positive for diarrhea and nausea. Negative for abdominal pain and vomiting.  Musculoskeletal: Positive for myalgias (generalized).  All other systems reviewed and are negative.    Physical Exam Updated Vital Signs BP (!) 180/88   Pulse 92   Temp 100.2 F (37.9 C) (Oral)   Resp 14   SpO2 99%   Physical Exam Vitals signs and nursing note reviewed.  Constitutional:      General: He is not in acute distress.    Appearance: He is well-developed. He is not toxic-appearing.  HENT:     Head: Normocephalic and atraumatic.     Right Ear: Tympanic membrane  and ear canal normal.     Left Ear: Tympanic membrane and ear canal normal.     Mouth/Throat:     Mouth: Mucous membranes are moist.     Pharynx: Oropharynx is clear.  Eyes:     Conjunctiva/sclera: Conjunctivae normal.  Neck:     Musculoskeletal: Normal range of motion and neck supple. No neck rigidity.  Cardiovascular:     Rate and Rhythm: Normal rate and regular rhythm.  Pulmonary:     Effort: Pulmonary effort is normal. No respiratory distress.     Breath sounds: Normal breath sounds.  Abdominal:     Palpations: Abdomen is soft.  Skin:    General: Skin is warm.  Neurological:     Mental Status: He is alert.  Psychiatric:        Behavior: Behavior normal.      ED Treatments / Results  Labs (all labs ordered are listed, but only abnormal results are displayed) Labs Reviewed - No data to display  EKG None  Radiology Dg Chest 2 View  Result Date: 03/26/2018 CLINICAL DATA:  Cough   Chest since  Last  night EXAM: CHEST - 2 VIEW COMPARISON:  08/10/2015 FINDINGS: Patchy airspace opacities in the posterior basilar segment left lower lobe. Right lung clear. Heart size and mediastinal contours are within normal limits. No effusion identified, although the posterior costophrenic angles are excluded. Visualized bones unremarkable. IMPRESSION: Posterior left lower lobe pneumonia. Electronically Signed   By: Corlis Leak M.D.   On: 03/26/2018 13:53    Procedures Procedures (including critical care time)  Medications Ordered in ED Medications  ibuprofen (ADVIL,MOTRIN) tablet 600 mg (600 mg Oral Given 03/26/18 1257)  doxycycline (VIBRA-TABS) tablet 100 mg (100 mg Oral Given 03/26/18 1429)     Initial Impression / Assessment and Plan / ED Course  I have reviewed the triage vital signs and the nursing notes.  Pertinent labs & imaging results that were available during my care of the patient were reviewed by me and considered in my medical decision making (see chart for details).       Patient presenting with upper respiratory symptoms that began yesterday.  On arrival he has low-grade fever of 100.4 F. Hypertensive, however with normal heart rate, more respiratory rate, and oxygenating at 99% on room air.  Normal work of breathing.  Lungs are clear bilaterally.  ENT exam is reassuring.  Given patient's history of productive cough, chest x-ray obtained to rule out pneumonia.  Body aches and fever treated with Motrin.  Chest x-ray revealing posterior left lower lobe pneumonia.  Results discussed with patient, he states this feels very similar to how he felt a few years back when he had community-acquired pneumonia.  No recent antibiotics or hospitalizations.  Low  risk with curb 65 score (BUN today is unknown, however still remains low risk).  Normal work of breathing, no evidence of respiratory distress.  Does not have multiple comorbidities and therefore feel he is safe for outpatient treatment.  Patient is hypertensive, history of hypertension that is untreated.  He does not have a PCP.  Discussed symptomatic management.  Will prescribe doxycycline.  Close follow-up within 2 days, patient is to return to urgent care follow-up since he does not have PCP.  Patient is instructed return the emergency department sooner for worsening symptoms or difficulty breathing.  Discussed results, findings, treatment and follow up. Patient advised of return precautions. Patient verbalized understanding and agreed with plan.  Final Clinical Impressions(s) / ED Diagnoses   Final diagnoses:  Community acquired pneumonia of left lower lobe of lung [facility])    ED Discharge Orders         Ordered    doxycycline (VIBRAMYCIN) 100 MG capsule  2 times daily     03/26/18 1420           Robinson, [place] N, [place] 03/26/18 1437    Little, Ambrose Finland, MD 03/27/18 [phone]

## 2018-03-26 NOTE — Discharge Instructions (Addendum)
Please read instructions below.  Alternate Tylenol and Advil every 4 hours for body aches and fever. You have been given your first dose of antibiotic today.  Take as prescribed until gone. It is important that you drink plenty of water to stay hydrated. Use saline nasal spray for congestion. Follow up with your primary care provider or urgent care within 2 days for recheck. Return to the ER for difficulty breathing, uncontrollable fevers, or new or worsening symptoms.

## 2018-03-26 NOTE — ED Notes (Signed)
Patient verbalizes understanding of discharge instructions. Opportunity for questioning and answers were provided. Armband removed by staff, pt discharged from ED. Pt ambulatory to lobby.  

## 2018-03-26 NOTE — ED Triage Notes (Signed)
Pt reports just not feeling well with flu like symptoms, cough, congestion and fever beginning last night.

## 2018-08-11 ENCOUNTER — Emergency Department (HOSPITAL_COMMUNITY)
Admission: EM | Admit: 2018-08-11 | Discharge: 2018-08-11 | Disposition: A | Discharge status: Home / Self Care | Payer: Self-pay | Attending: Emergency Medicine | Admitting: Emergency Medicine

## 2018-08-11 DIAGNOSIS — L0291 Cutaneous abscess, unspecified: Secondary | ICD-10-CM

## 2018-08-11 DIAGNOSIS — F17210 Nicotine dependence, cigarettes, uncomplicated: Secondary | ICD-10-CM | POA: Insufficient documentation

## 2018-08-11 DIAGNOSIS — I10 Essential (primary) hypertension: Secondary | ICD-10-CM | POA: Insufficient documentation

## 2018-08-11 DIAGNOSIS — L02211 Cutaneous abscess of abdominal wall: Secondary | ICD-10-CM | POA: Insufficient documentation

## 2018-08-11 DIAGNOSIS — F1210 Cannabis abuse, uncomplicated: Secondary | ICD-10-CM | POA: Insufficient documentation

## 2018-08-11 MED ORDER — DOXYCYCLINE HYCLATE 100 MG PO TABS
100.0000 mg | ORAL_TABLET | Freq: Once | ORAL | Status: AC
  Administered 2018-08-11: 100 mg via ORAL
  Filled 2018-08-11: qty 1

## 2018-08-11 MED ORDER — DOXYCYCLINE HYCLATE 100 MG PO CAPS: 100.0000 mg | ORAL_CAPSULE | Freq: Two times a day (BID) | ORAL | 0 refills | Status: DC

## 2018-08-11 MED ORDER — LIDOCAINE HCL 2 % IJ SOLN
20.0000 mL | Freq: Once | INTRAMUSCULAR | Status: AC
  Administered 2018-08-11: 400 mg
  Filled 2018-08-11: qty 20

## 2018-08-11 NOTE — ED Triage Notes (Addendum)
Pt endorses possible spider bite to right lower abd. Slight redness around area. Denies fever or chills. Hypertensive and has hx of same, does not take his BP medications.

## 2018-08-11 NOTE — ED Notes (Signed)
ED Provider at bedside. 

## 2018-08-11 NOTE — ED Notes (Signed)
EDP notified of BP and EDP at bedside

## 2018-08-11 NOTE — Discharge Instructions (Addendum)
Please read attached information. If you experience any new or worsening signs or symptoms please return to the emergency room for evaluation. Please follow-up with your primary care provider or specialist as discussed. Please use medication prescribed only as directed and discontinue taking if you have any concerning signs or symptoms.   °

## 2018-08-11 NOTE — ED Notes (Signed)
Patient verbalizes understanding of discharge instructions. Opportunity for questioning and answers were provided. Armband removed by staff, pt discharged from ED. Prescription and follow up care reviewed. 

## 2018-08-11 NOTE — ED Provider Notes (Signed)
[facility] EMERGENCY DEPARTMENT Provider Note   CSN: [id number] Arrival date & time: 08/11/18  1834    History   Chief Complaint Chief Complaint  Patient presents with  . Insect Bite    HPI Alec Grant is a 43 y.o. male.     HPI   43 year old male presents today with complaints of abscess to his abdomen.  Patient notes approximately 3 days ago he developed a area of redness and pain to his lower abdomen.  He notes this is similar to previous abscess.  He notes no purulent drainage, denies any fever, nausea or vomiting.  No chronic health conditions, no history of significant skin infections.    Past Medical History:  Diagnosis Date  . Chronic bronchitis    pt doesnt remember date  . Hypertension   . Migraines   . Pneumonia   . Pulmonary embolism [facility])     Patient Active Problem List   Diagnosis Date Noted  . Migraines   . External hemorrhoid, thrombosed 05/21/2011  . HTN (hypertension), malignant 03/09/2011  . Pneumonia 03/08/2011    No past surgical history on file.      Home Medications    Prior to Admission medications   Medication Sig Start Date End Date Taking? Authorizing Provider  acetaminophen (TYLENOL 8 HOUR) 650 MG CR tablet Take 1 tablet (650 mg total) by mouth every 8 (eight) hours as needed for pain. 09/27/17   Kellie Shropshire, PA-C  cephALEXin (KEFLEX) 500 MG capsule Take 1 capsule (500 mg total) by mouth 4 (four) times daily. 01/11/18   Joy, Shawn C, PA-C  cyclobenzaprine (FLEXERIL) 10 MG tablet Take 1 tablet (10 mg total) by mouth 2 (two) times daily as needed for muscle spasms. 09/27/17   Kellie Shropshire, PA-C  doxycycline (VIBRAMYCIN) 100 MG capsule Take 1 capsule (100 mg total) by mouth 2 (two) times daily. 08/11/18   Ronica Vivian, Tinnie Gens, PA-C  hydrochlorothiazide (HYDRODIURIL) 25 MG tablet Take 1 tablet (25 mg total) by mouth daily. 09/19/17   Melene Plan, DO  HYDROcodone-acetaminophen (NORCO/VICODIN) 5-325 MG tablet  Take 1 tablet by mouth every 6 (six) hours as needed for severe pain. 01/11/18   Joy, Shawn C, PA-C  ibuprofen (ADVIL,MOTRIN) 600 MG tablet Take 1 tablet (600 mg total) by mouth every 6 (six) hours as needed. 01/11/18   Joy, Shawn C, PA-C  lisinopril (PRINIVIL,ZESTRIL) 10 MG tablet Take 1 tablet (10 mg total) by mouth daily. 01/21/16   Preston Fleeting, MD    Family History Family History  Problem Relation Age of Onset  . Stroke Mother   . Diabetes type II Mother     Social History Social History   Tobacco Use  . Smoking status: Current Every Day Smoker    Packs/day: 1.50    Years: 15.00    Pack years: 22.50    Types: Cigarettes  . Smokeless tobacco: Never Used  Substance Use Topics  . Alcohol use: Yes    Comment: Occassional Use  . Drug use: Yes    Frequency: 4.0 times per week    Types: Marijuana     Allergies   Patient has no known allergies.   Review of Systems Review of Systems  All other systems reviewed and are negative.    Physical Exam Updated Vital Signs BP (!) 190/114   Pulse 68   Temp 99.3 F (37.4 C) (Oral)   Resp 14   SpO2 99%   Physical Exam Vitals signs and nursing  note reviewed.  Constitutional:      Appearance: He is well-developed.  HENT:     Head: Normocephalic and atraumatic.  Eyes:     General: No scleral icterus.       Right eye: No discharge.        Left eye: No discharge.     Conjunctiva/sclera: Conjunctivae normal.     Pupils: Pupils are equal, round, and reactive to light.  Neck:     Musculoskeletal: Normal range of motion.     Vascular: No JVD.     Trachea: No tracheal deviation.  Pulmonary:     Effort: Pulmonary effort is normal.     Breath sounds: No stridor.  Skin:    Comments: 2 cm area of induration to the right lower abdomen, all central pustule-this is mobile from the abdominal wall and is localized within the skin-no surrounding abdominal tenderness  Neurological:     Mental Status: He is alert and oriented to  person, place, and time.     Coordination: Coordination normal.  Psychiatric:        Behavior: Behavior normal.        Thought Content: Thought content normal.        Judgment: Judgment normal.      ED Treatments / Results  Labs (all labs ordered are listed, but only abnormal results are displayed) Labs Reviewed - No data to display  EKG None  Radiology No results found.  Procedures .Marland KitchenIncision and Drainage Date/Time: 08/12/2018 3:44 PM Performed by: Eyvonne Mechanic, PA-C Authorized by: Eyvonne Mechanic, PA-C   Consent:    Consent obtained:  Verbal   Consent given by:  Patient   Risks discussed:  Bleeding, damage to other organs, incomplete drainage, infection and pain   Alternatives discussed:  No treatment, delayed treatment and alternative treatment Location:    Type:  Abscess   Size:  2   Location: Lower abdomen. Pre-procedure details:    Skin preparation:  Chloraprep Anesthesia (see MAR for exact dosages):    Anesthesia method:  Local infiltration   Local anesthetic:  Lidocaine 2% w/o epi Procedure type:    Complexity:  Simple Procedure details:    Needle aspiration: no     Incision types:  Single straight   Incision depth:  Dermal   Scalpel blade:  11   Wound management:  Probed and deloculated   Drainage:  Purulent   Drainage amount:  Moderate   Wound treatment:  Wound left open   Packing materials:  None Post-procedure details:    Patient tolerance of procedure:  Tolerated well, no immediate complications   (including critical care time)  Medications Ordered in ED Medications  lidocaine (XYLOCAINE) 2 % (with pres) injection 400 mg (400 mg Infiltration Given by Other 08/11/18 1957)  doxycycline (VIBRA-TABS) tablet 100 mg (100 mg Oral Given 08/11/18 2051)     Initial Impression / Assessment and Plan / ED Course  I have reviewed the triage vital signs and the nursing notes.  Pertinent labs & imaging results that were available during my care of the  patient were reviewed by me and considered in my medical decision making (see chart for details).        43 year old male with uncomplicated abscess here.  I&D successful.  Discharged home with antibiotics wound care and strict precautions.  She was also noted to be hypertensive here he denied any associated complaints from this.  Encouraged him to follow-up as an outpatient with primary care for reevaluation  and blood pressure management.  Return precautions given.  Verbalized understanding and agreement to today's plan.  Final Clinical Impressions(s) / ED Diagnoses   Final diagnoses:  Abscess    ED Discharge Orders         Ordered    doxycycline (VIBRAMYCIN) 100 MG capsule  2 times daily     08/11/18 2033           Eyvonne MechanicHedges, Evalyse Stroope, PA-C 08/12/18 1545    Melene PlanFloyd, Dan, DO 08/13/18 1114

## 2018-10-17 ENCOUNTER — Emergency Department (HOSPITAL_COMMUNITY)
Admission: EM | Admit: 2018-10-17 | Discharge: 2018-10-17 | Disposition: A | Discharge status: Home / Self Care | Payer: Self-pay | Attending: Emergency Medicine | Admitting: Emergency Medicine

## 2018-10-17 ENCOUNTER — Other Ambulatory Visit: Payer: Self-pay

## 2018-10-17 ENCOUNTER — Encounter (HOSPITAL_COMMUNITY): Payer: Self-pay

## 2018-10-17 ENCOUNTER — Emergency Department (HOSPITAL_COMMUNITY): Payer: Self-pay

## 2018-10-17 DIAGNOSIS — L089 Local infection of the skin and subcutaneous tissue, unspecified: Secondary | ICD-10-CM | POA: Insufficient documentation

## 2018-10-17 DIAGNOSIS — Z79899 Other long term (current) drug therapy: Secondary | ICD-10-CM | POA: Insufficient documentation

## 2018-10-17 DIAGNOSIS — Z86711 Personal history of pulmonary embolism: Secondary | ICD-10-CM | POA: Insufficient documentation

## 2018-10-17 DIAGNOSIS — I10 Essential (primary) hypertension: Secondary | ICD-10-CM | POA: Insufficient documentation

## 2018-10-17 DIAGNOSIS — M79644 Pain in right finger(s): Secondary | ICD-10-CM | POA: Insufficient documentation

## 2018-10-17 DIAGNOSIS — F17210 Nicotine dependence, cigarettes, uncomplicated: Secondary | ICD-10-CM | POA: Insufficient documentation

## 2018-10-17 MED ORDER — IBUPROFEN 800 MG PO TABS: 800.0000 mg | ORAL_TABLET | Freq: Three times a day (TID) | ORAL | 0 refills | Status: DC | PRN

## 2018-10-17 MED ORDER — SULFAMETHOXAZOLE-TRIMETHOPRIM 800-160 MG PO TABS: 1.0000 | ORAL_TABLET | Freq: Two times a day (BID) | ORAL | 0 refills | Status: AC

## 2018-10-17 MED ORDER — TRAMADOL HCL 50 MG PO TABS: 50.0000 mg | ORAL_TABLET | Freq: Four times a day (QID) | ORAL | 0 refills | Status: AC | PRN

## 2018-10-17 MED ORDER — CHLORHEXIDINE GLUCONATE 4 % EX LIQD
Freq: Once | CUTANEOUS | Status: AC
  Administered 2018-10-17: 13:00:00 via TOPICAL
  Filled 2018-10-17: qty 90

## 2018-10-17 MED ORDER — SULFAMETHOXAZOLE-TRIMETHOPRIM 800-160 MG PO TABS: 2.0000 | ORAL_TABLET | Freq: Two times a day (BID) | ORAL | 0 refills | Status: AC

## 2018-10-17 NOTE — ED Notes (Signed)
Pt's right thumb wound is cleansed with betadine and sterile saline, dressing applied with bacitracin and gauze wrap.  Pt tolerated it well.

## 2018-10-17 NOTE — Discharge Instructions (Signed)
Return here as needed.  You will need to return to these areas start become less firm and more fluid-filled.  Use warm bath soaks.  Also wash head to toe with the cleanser provided.

## 2018-10-17 NOTE — ED Notes (Signed)
ED Provider at bedside. 

## 2018-10-17 NOTE — ED Triage Notes (Signed)
Pt has recent hx of skin abscess with recurrent skin infection.  Pt has multiple abscess in left axilla and one starting on his right buttocks.  Pt states "a couple months ago pt was prescribed antibiotics and could not get them filled due to work.  Pt also has a crushing injury to right thumb where he smashed it between the counter and a shelf.

## 2018-10-20 NOTE — ED Provider Notes (Signed)
[facility] Provider Note   CSN: [id number] Arrival date & time: 10/17/18  [id number]     History   Chief Complaint Chief Complaint  Patient presents with  . Recurrent Skin Infections    HPI Alec Grant is a 43 y.o. male.     HPI Patient presents to the emergency department with multiple skin abscesses that are in the axilla region and buttock.  Patient states that he was post take antibiotics several months ago for similar issue and did not take it.  Patient states he smashed his right thumb yesterday under shelf.  Patient states that several of the areas have drained pus.  Patient denies fever, nausea, vomiting, weakness, dizziness, numbness or syncope. Past Medical History:  Diagnosis Date  . Chronic bronchitis    pt doesnt remember date  . Hypertension   . Migraines   . Pneumonia   . Pulmonary embolism [facility](HCC)     Patient Active Problem List   Diagnosis Date Noted  . Migraines   . External hemorrhoid, thrombosed 05/21/2011  . HTN (hypertension), malignant 03/09/2011  . Pneumonia 03/08/2011    History reviewed. No pertinent surgical history.      Home Medications    Prior to Admission medications   Medication Sig Start Date End Date Taking? Authorizing Provider  acetaminophen (TYLENOL 8 HOUR) 650 MG CR tablet Take 1 tablet (650 mg total) by mouth every 8 (eight) hours as needed for pain. 09/27/17   Kellie ShropshireShrosbree, Emily J, PA-C  cephALEXin (KEFLEX) 500 MG capsule Take 1 capsule (500 mg total) by mouth 4 (four) times daily. 01/11/18   Joy, Shawn C, PA-C  cyclobenzaprine (FLEXERIL) 10 MG tablet Take 1 tablet (10 mg total) by mouth 2 (two) times daily as needed for muscle spasms. 09/27/17   Kellie ShropshireShrosbree, Emily J, PA-C  doxycycline (VIBRAMYCIN) 100 MG capsule Take 1 capsule (100 mg total) by mouth 2 (two) times daily. 08/11/18   Hedges, Tinnie GensJeffrey, PA-C  hydrochlorothiazide (HYDRODIURIL) 25 MG tablet Take 1 tablet (25 mg total) by mouth daily.  09/19/17   Melene PlanFloyd, Dan, DO  HYDROcodone-acetaminophen (NORCO/VICODIN) 5-325 MG tablet Take 1 tablet by mouth every 6 (six) hours as needed for severe pain. 01/11/18   Joy, Shawn C, PA-C  ibuprofen (ADVIL) 800 MG tablet Take 1 tablet (800 mg total) by mouth every 8 (eight) hours as needed. 10/17/18   Yentl Verge, Cristal Deerhristopher, PA-C  lisinopril (PRINIVIL,ZESTRIL) 10 MG tablet Take 1 tablet (10 mg total) by mouth daily. 01/21/16   Preston FleetingSchubert, Anna, MD  sulfamethoxazole-trimethoprim (BACTRIM DS) 800-160 MG tablet Take 1 tablet by mouth 2 (two) times daily for 7 days. 10/17/18 10/24/18  Atley Neubert, Cristal Deerhristopher, PA-C  sulfamethoxazole-trimethoprim (BACTRIM DS) 800-160 MG tablet Take 2 tablets by mouth 2 (two) times daily for 10 days. 10/17/18 10/27/18  Velinda Wrobel, Cristal Deerhristopher, PA-C  traMADol (ULTRAM) 50 MG tablet Take 1 tablet (50 mg total) by mouth every 6 (six) hours as needed for severe pain. 10/17/18   Charlestine NightLawyer, Va, PA-C    Family History Family History  Problem Relation Age of Onset  . Stroke Mother   . Diabetes type II Mother     Social History Social History   Tobacco Use  . Smoking status: Current Every Day Smoker    Packs/day: 1.50    Years: 15.00    Pack years: 22.50    Types: Cigarettes  . Smokeless tobacco: Never Used  Substance Use Topics  . Alcohol use: Not Currently    Comment: Occassional Use  .  Drug use: Yes    Frequency: 4.0 times per week    Types: Marijuana     Allergies   Patient has no known allergies.   Review of Systems Review of Systems All other systems negative except as documented in the HPI. All pertinent positives and negatives as reviewed in the HPI.  Physical Exam Updated Vital Signs BP (!) 188/121 (BP Location: Left Arm)   Pulse 62   Temp 98.2 F (36.8 C) (Oral)   Resp 20   Ht 6\' 3"  (1.905 m)   Wt 99.8 kg   SpO2 99%   BMI 27.50 kg/m   Physical Exam Vitals signs and nursing note reviewed.  Constitutional:      General: He is not in acute distress.     Appearance: He is well-developed.  HENT:     Head: Normocephalic and atraumatic.  Eyes:     Pupils: Pupils are equal, round, and reactive to light.  Pulmonary:     Effort: Pulmonary effort is normal.  Skin:    General: Skin is warm and dry.       Neurological:     Mental Status: He is alert and oriented to person, place, and time.      ED Treatments / Results  Labs (all labs ordered are listed, but only abnormal results are displayed) Labs Reviewed - No data to display  EKG None  Radiology No results found.  Procedures Procedures (including critical care time)  Medications Ordered in ED Medications  chlorhexidine (HIBICLENS) 4 % liquid ( Topical Given 10/17/18 1312)     Initial Impression / Assessment and Plan / ED Course  I have reviewed the triage vital signs and the nursing notes.  Pertinent labs & imaging results that were available during my care of the patient were reviewed by me and considered in my medical decision making (see chart for details).        Patient is given antibiotics and Hibiclens.  I did advise him that these areas could become more fluctuant and will need to be drained at that time.  Patient is advised to use warm compresses and warm bath soaks as well.  Final Clinical Impressions(s) / ED Diagnoses   Final diagnoses:  Skin infection    ED Discharge Orders         Ordered    sulfamethoxazole-trimethoprim (BACTRIM DS) 800-160 MG tablet  2 times daily     10/17/18 1246    sulfamethoxazole-trimethoprim (BACTRIM DS) 800-160 MG tablet  2 times daily     10/17/18 1253    traMADol (ULTRAM) 50 MG tablet  Every 6 hours PRN     10/17/18 1253    ibuprofen (ADVIL) 800 MG tablet  Every 8 hours PRN     10/17/18 1253           Djon, Tith, PA-C 10/20/18 2348    Blanchie Dessert, MD 10/21/18 [phone]

## 2018-11-05 ENCOUNTER — Emergency Department (HOSPITAL_COMMUNITY)
Admission: EM | Admit: 2018-11-05 | Discharge: 2018-11-05 | Disposition: A | Discharge status: Home / Self Care | Payer: Self-pay | Attending: Emergency Medicine | Admitting: Emergency Medicine

## 2018-11-05 ENCOUNTER — Other Ambulatory Visit: Payer: Self-pay

## 2018-11-05 ENCOUNTER — Encounter (HOSPITAL_COMMUNITY): Payer: Self-pay | Admitting: Emergency Medicine

## 2018-11-05 DIAGNOSIS — F1290 Cannabis use, unspecified, uncomplicated: Secondary | ICD-10-CM | POA: Insufficient documentation

## 2018-11-05 DIAGNOSIS — Z86711 Personal history of pulmonary embolism: Secondary | ICD-10-CM | POA: Insufficient documentation

## 2018-11-05 DIAGNOSIS — Y999 Unspecified external cause status: Secondary | ICD-10-CM | POA: Insufficient documentation

## 2018-11-05 DIAGNOSIS — W208XXA Other cause of strike by thrown, projected or falling object, initial encounter: Secondary | ICD-10-CM | POA: Insufficient documentation

## 2018-11-05 DIAGNOSIS — S0501XA Injury of conjunctiva and corneal abrasion without foreign body, right eye, initial encounter: Secondary | ICD-10-CM | POA: Insufficient documentation

## 2018-11-05 DIAGNOSIS — Y92017 Garden or yard in single-family (private) house as the place of occurrence of the external cause: Secondary | ICD-10-CM | POA: Insufficient documentation

## 2018-11-05 DIAGNOSIS — Z79899 Other long term (current) drug therapy: Secondary | ICD-10-CM | POA: Insufficient documentation

## 2018-11-05 DIAGNOSIS — F17210 Nicotine dependence, cigarettes, uncomplicated: Secondary | ICD-10-CM | POA: Insufficient documentation

## 2018-11-05 DIAGNOSIS — Y93H2 Activity, gardening and landscaping: Secondary | ICD-10-CM | POA: Insufficient documentation

## 2018-11-05 DIAGNOSIS — I10 Essential (primary) hypertension: Secondary | ICD-10-CM | POA: Insufficient documentation

## 2018-11-05 MED ORDER — FLUORESCEIN SODIUM 1 MG OP STRP
1.0000 | ORAL_STRIP | Freq: Once | OPHTHALMIC | Status: AC
  Administered 2018-11-05: 10:00:00 1 via OPHTHALMIC
  Filled 2018-11-05: qty 1

## 2018-11-05 MED ORDER — ERYTHROMYCIN 5 MG/GM OP OINT: 1.0000 "application " | TOPICAL_OINTMENT | Freq: Four times a day (QID) | OPHTHALMIC | 1 refills | Status: AC

## 2018-11-05 MED ORDER — TETRACAINE HCL 0.5 % OP SOLN
2.0000 [drp] | Freq: Once | OPHTHALMIC | Status: AC
  Administered 2018-11-05: 10:00:00 2 [drp] via OPHTHALMIC
  Filled 2018-11-05: qty 4

## 2018-11-05 NOTE — ED Triage Notes (Signed)
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

## 2018-11-05 NOTE — Discharge Instructions (Addendum)
Contact a health care provider if: °You continue to have eye pain and other symptoms for more than 2 days. °You develop new symptoms, such as redness, tearing, or discharge. °You have discharge that makes your eyelids stick together in the morning. °Your eye patch becomes so loose that you can blink your eye. °Symptoms return after the original abrasion has healed. °Get help right away if: °You have severe eye pain that does not get better with medicine. °You have vision loss. °

## 2018-11-05 NOTE — ED Provider Notes (Signed)
[place] EMERGENCY DEPARTMENT Provider Note   CSN: [id number] Arrival date & time: 11/05/18  2229     History   Chief Complaint Chief Complaint  Patient presents with  . Eye Pain    HPI Alec Grant is a 43 y.o. male.  Who presents emergency department for evaluation of right eye discomfort.  Patient states that he was working in the yard yesterday mowing the grass when he felt like something went into his right eye.  He states that he washed his eye out and felt like everything was okay however when he woke this morning he had some crusting on his lashes, mild photophobia, eye irritation and foreign body sensation.  He denies any severe eye pain.  He does not wear glasses or contact lenses.   He has some intermittent blurriness in the right eye     HPI  Past Medical History:  Diagnosis Date  . Chronic bronchitis    pt doesnt remember date  . Hypertension   . Migraines   . Pneumonia   . Pulmonary embolism [facility])     Patient Active Problem List   Diagnosis Date Noted  . Migraines   . External hemorrhoid, thrombosed 05/21/2011  . HTN (hypertension), malignant 03/09/2011  . Pneumonia 03/08/2011    History reviewed. No pertinent surgical history.      Home Medications    Prior to Admission medications   Medication Sig Start Date End Date Taking? Authorizing Provider  acetaminophen (TYLENOL 8 HOUR) 650 MG CR tablet Take 1 tablet (650 mg total) by mouth every 8 (eight) hours as needed for pain. 09/27/17   Glyn Ade, PA-C  cephALEXin (KEFLEX) 500 MG capsule Take 1 capsule (500 mg total) by mouth 4 (four) times daily. 01/11/18   Joy, Shawn C, PA-C  cyclobenzaprine (FLEXERIL) 10 MG tablet Take 1 tablet (10 mg total) by mouth 2 (two) times daily as needed for muscle spasms. 09/27/17   Glyn Ade, PA-C  doxycycline (VIBRAMYCIN) 100 MG capsule Take 1 capsule (100 mg total) by mouth 2 (two) times daily. 08/11/18   Hedges, Dellis Filbert, PA-C   hydrochlorothiazide (HYDRODIURIL) 25 MG tablet Take 1 tablet (25 mg total) by mouth daily. 09/19/17   Deno Etienne, DO  HYDROcodone-acetaminophen (NORCO/VICODIN) 5-325 MG tablet Take 1 tablet by mouth every 6 (six) hours as needed for severe pain. 01/11/18   Joy, Shawn C, PA-C  ibuprofen (ADVIL) 800 MG tablet Take 1 tablet (800 mg total) by mouth every 8 (eight) hours as needed. 10/17/18   Lawyer, Harrell Gave, PA-C  lisinopril (PRINIVIL,ZESTRIL) 10 MG tablet Take 1 tablet (10 mg total) by mouth daily. 01/21/16   Harlin Heys, MD  traMADol (ULTRAM) 50 MG tablet Take 1 tablet (50 mg total) by mouth every 6 (six) hours as needed for severe pain. 10/17/18   Dalia Heading, PA-C    Family History Family History  Problem Relation Age of Onset  . Stroke Mother   . Diabetes type II Mother     Social History Social History   Tobacco Use  . Smoking status: Current Every Day Smoker    Packs/day: 0.50    Years: 15.00    Pack years: 7.50    Types: Cigarettes  . Smokeless tobacco: Never Used  Substance Use Topics  . Alcohol use: Not Currently    Comment: Occassional Use  . Drug use: Yes    Frequency: 4.0 times per week    Types: Marijuana     Allergies  Patient has no known allergies.   Review of Systems Review of Systems  Ten systems reviewed and are negative for acute change, except as noted in the HPI.  Physical Exam Updated Vital Signs BP (!) 153/104 (BP Location: Right Arm)   Pulse 69   Temp 98 F (36.7 C) (Oral)   Resp 16   SpO2 96%   Physical Exam Vitals signs and nursing note reviewed.  Constitutional:      General: He is not in acute distress.    Appearance: He is well-developed. He is not diaphoretic.  HENT:     Head: Normocephalic and atraumatic.  Eyes:     General: Lids are normal. Lids are everted, no foreign bodies appreciated. Vision grossly intact. Gaze aligned appropriately. No scleral icterus.    Intraocular pressure: Right eye pressure is 15 mmHg. Left  eye pressure is 18 mmHg.     Conjunctiva/sclera: Conjunctivae normal.     Pupils:     Right eye: Corneal abrasion and fluorescein uptake present. Seidel exam negative.     Left eye: Seidel exam negative. Neck:     Musculoskeletal: Normal range of motion and neck supple.  Cardiovascular:     Rate and Rhythm: Normal rate and regular rhythm.     Heart sounds: Normal heart sounds.  Pulmonary:     Effort: Pulmonary effort is normal. No respiratory distress.     Breath sounds: Normal breath sounds.  Abdominal:     Palpations: Abdomen is soft.     Tenderness: There is no abdominal tenderness.  Skin:    General: Skin is warm and dry.  Neurological:     Mental Status: He is alert.  Psychiatric:        Behavior: Behavior normal.      ED Treatments / Results  Labs (all labs ordered are listed, but only abnormal results are displayed) Labs Reviewed - No data to display  EKG None  Radiology No results found.  Procedures Procedures (including critical care time)  Medications Ordered in ED Medications  fluorescein ophthalmic strip 1 strip (1 strip Both Eyes Given 11/05/18 1015)  tetracaine (PONTOCAINE) 0.5 % ophthalmic solution 2 drop (2 drops Both Eyes Given 11/05/18 1015)     Initial Impression / Assessment and Plan / ED Course  I have reviewed the triage vital signs and the nursing notes.  Pertinent labs & imaging results that were available during my care of the patient were reviewed by me and considered in my medical decision making (see chart for details).        Corneal abrasion  Pt with corneal abrasion on PE.Marland Kitchen. Eye irrigated w NS, no evidence of FB.  No change in vision, acuity equal bilaterally.  Pt is not a contact lens wearer.  Exam non-concerning for orbital cellulitis, hyphema, corneal ulcers. Patient will be discharged home with erythromycin.   Patient understands to follow up with ophthalmology, & to return to ER if new symptoms develop including change in  vision, purulent drainage, or entrapment.    Final Clinical Impressions(s) / ED Diagnoses   Final diagnoses:  None    ED Discharge Orders    None       Arthor CaptainHarris, Alvine Mostafa, PA-C 11/05/18 1320    Pricilla LovelessGoldston, Scott, MD 11/06/18 1109

## 2019-01-30 ENCOUNTER — Encounter (HOSPITAL_COMMUNITY): Payer: Self-pay | Admitting: Emergency Medicine

## 2019-01-30 ENCOUNTER — Other Ambulatory Visit: Payer: Self-pay

## 2019-01-30 ENCOUNTER — Emergency Department (HOSPITAL_COMMUNITY): Payer: Self-pay

## 2019-01-30 ENCOUNTER — Emergency Department (HOSPITAL_COMMUNITY)
Admission: EM | Admit: 2019-01-30 | Discharge: 2019-01-30 | Disposition: A | Discharge status: Home / Self Care | Payer: Self-pay | Attending: Emergency Medicine | Admitting: Emergency Medicine

## 2019-01-30 DIAGNOSIS — Z23 Encounter for immunization: Secondary | ICD-10-CM | POA: Insufficient documentation

## 2019-01-30 DIAGNOSIS — W450XXA Nail entering through skin, initial encounter: Secondary | ICD-10-CM | POA: Insufficient documentation

## 2019-01-30 DIAGNOSIS — Y929 Unspecified place or not applicable: Secondary | ICD-10-CM | POA: Insufficient documentation

## 2019-01-30 DIAGNOSIS — S91331A Puncture wound without foreign body, right foot, initial encounter: Secondary | ICD-10-CM | POA: Insufficient documentation

## 2019-01-30 DIAGNOSIS — F17210 Nicotine dependence, cigarettes, uncomplicated: Secondary | ICD-10-CM | POA: Insufficient documentation

## 2019-01-30 DIAGNOSIS — Z79899 Other long term (current) drug therapy: Secondary | ICD-10-CM | POA: Insufficient documentation

## 2019-01-30 DIAGNOSIS — I10 Essential (primary) hypertension: Secondary | ICD-10-CM | POA: Insufficient documentation

## 2019-01-30 DIAGNOSIS — Y990 Civilian activity done for income or pay: Secondary | ICD-10-CM | POA: Insufficient documentation

## 2019-01-30 DIAGNOSIS — Y939 Activity, unspecified: Secondary | ICD-10-CM | POA: Insufficient documentation

## 2019-01-30 MED ORDER — CIPROFLOXACIN HCL 500 MG PO TABS: 500.0000 mg | ORAL_TABLET | Freq: Two times a day (BID) | ORAL | 0 refills | Status: DC

## 2019-01-30 MED ORDER — TETANUS-DIPHTH-ACELL PERTUSSIS 5-2.5-18.5 LF-MCG/0.5 IM SUSP
0.5000 mL | Freq: Once | INTRAMUSCULAR | Status: AC
  Administered 2019-01-30: 0.5 mL via INTRAMUSCULAR
  Filled 2019-01-30: qty 0.5

## 2019-01-30 NOTE — ED Triage Notes (Signed)
Pt stepped on 2 nails with R foot yesterday.  C/o puncture wound x 2 to bottom of R foot.

## 2019-01-30 NOTE — Discharge Instructions (Signed)
Take the antibiotics as prescribed.  May take Tylenol ibuprofen as needed for pain.  Do not take more than 4000 g Tylenol or more than 2400 mg ibuprofen daily.  May  also place ice to this area.  Follow-up in 2 days for wound recheck.

## 2019-01-30 NOTE — ED Notes (Signed)
Patient verbalizes understanding of discharge instructions. Opportunity for questioning and answers were provided. Armband removed by staff, pt discharged from ED ambulatory.   

## 2019-01-30 NOTE — ED Provider Notes (Signed)
[facility] EMERGENCY DEPARTMENT Provider Note   CSN: [id number] Arrival date & time: 01/30/19  3154     History   Chief Complaint Chief Complaint  Patient presents with  . Stepped on nail    HPI Alec Grant is a 43 y.o. male with history significant for hypertension, PE who presents for evaluation of wound.  Patient states yesterday while at work he stepped on a pallet and the nail went through his shoe and into external aspect of his foot.  Patient states he was able to remove the symptoms.  He denies any bleeding or drainage to them.  Patient states he has had some tenderness just proximal to his first metacarpal where the wound was.  He denies fever, chills, nausea, vomiting, decreased range of motion, redness, swelling, warmth, drainage, bleeding, paresthesias from wound.  He is not taking anything for his symptoms.  He has been ambulatory without difficulty.  Also states he has history of recurrent abscesses.  Does not have any currently.  He is wondering there are any additional medications to prevent this.  He was previously given chlorhexidine wash during a previous emergency department visit however he has not used this.  History obtained from patient and past medical records.  No interpreter is used.     HPI  Past Medical History:  Diagnosis Date  . Chronic bronchitis    pt doesnt remember date  . Hypertension   . Migraines   . Pneumonia   . Pulmonary embolism [facility])     Patient Active Problem List   Diagnosis Date Noted  . Migraines   . External hemorrhoid, thrombosed 05/21/2011  . HTN (hypertension), malignant 03/09/2011  . Pneumonia 03/08/2011    History reviewed. No pertinent surgical history.      Home Medications    Prior to Admission medications   Medication Sig Start Date End Date Taking? Authorizing Provider  acetaminophen (TYLENOL 8 HOUR) 650 MG CR tablet Take 1 tablet (650 mg total) by mouth every 8 (eight) hours as  needed for pain. 09/27/17   Glyn Ade, PA-C  cephALEXin (KEFLEX) 500 MG capsule Take 1 capsule (500 mg total) by mouth 4 (four) times daily. 01/11/18   Joy, Shawn C, PA-C  ciprofloxacin (CIPRO) 500 MG tablet Take 1 tablet (500 mg total) by mouth every 12 (twelve) hours. 01/30/19   Jalaina Salyers A, PA-C  cyclobenzaprine (FLEXERIL) 10 MG tablet Take 1 tablet (10 mg total) by mouth 2 (two) times daily as needed for muscle spasms. 09/27/17   Glyn Ade, PA-C  doxycycline (VIBRAMYCIN) 100 MG capsule Take 1 capsule (100 mg total) by mouth 2 (two) times daily. 08/11/18   Hedges, Dellis Filbert, PA-C  hydrochlorothiazide (HYDRODIURIL) 25 MG tablet Take 1 tablet (25 mg total) by mouth daily. 09/19/17   Deno Etienne, DO  HYDROcodone-acetaminophen (NORCO/VICODIN) 5-325 MG tablet Take 1 tablet by mouth every 6 (six) hours as needed for severe pain. 01/11/18   Joy, Shawn C, PA-C  ibuprofen (ADVIL) 800 MG tablet Take 1 tablet (800 mg total) by mouth every 8 (eight) hours as needed. 10/17/18   Lawyer, Harrell Gave, PA-C  lisinopril (PRINIVIL,ZESTRIL) 10 MG tablet Take 1 tablet (10 mg total) by mouth daily. 01/21/16   Harlin Heys, MD  traMADol (ULTRAM) 50 MG tablet Take 1 tablet (50 mg total) by mouth every 6 (six) hours as needed for severe pain. 10/17/18   Dalia Heading, PA-C    Family History Family History  Problem Relation Age  of Onset  . Stroke Mother   . Diabetes type II Mother     Social History Social History   Tobacco Use  . Smoking status: Current Every Day Smoker    Packs/day: 0.50    Years: 15.00    Pack years: 7.50    Types: Cigarettes  . Smokeless tobacco: Never Used  Substance Use Topics  . Alcohol use: Not Currently    Comment: Occassional Use  . Drug use: Yes    Frequency: 4.0 times per week    Types: Marijuana     Allergies   Patient has no known allergies.   Review of Systems Review of Systems  Constitutional: Negative.   HENT: Negative.   Respiratory:  Negative.   Cardiovascular: Negative.   Gastrointestinal: Negative.   Genitourinary: Negative.   Musculoskeletal: Negative.   Skin: Positive for wound.  Neurological: Negative.   All other systems reviewed and are negative.    Physical Exam Updated Vital Signs BP (!) 163/105   Pulse 75   Temp 97.7 F (36.5 C) (Oral)   Resp 17   SpO2 100%   Physical Exam Vitals signs and nursing note reviewed.  Constitutional:      General: He is not in acute distress.    Appearance: He is well-developed. He is not ill-appearing, toxic-appearing or diaphoretic.  HENT:     Head: Normocephalic and atraumatic.     Nose: Nose normal.     Mouth/Throat:     Mouth: Mucous membranes are moist.     Pharynx: Oropharynx is clear.  Eyes:     Pupils: Pupils are equal, round, and reactive to light.  Neck:     Musculoskeletal: Normal range of motion and neck supple.  Cardiovascular:     Rate and Rhythm: Normal rate and regular rhythm.     Pulses: Normal pulses.     Heart sounds: Normal heart sounds.  Pulmonary:     Effort: Pulmonary effort is normal. No respiratory distress.     Breath sounds: Normal breath sounds.  Abdominal:     General: Bowel sounds are normal. There is no distension.     Palpations: Abdomen is soft.  Musculoskeletal: Normal range of motion.     Right ankle: Normal.     Left ankle: Normal.     Right lower leg: Normal.     Left lower leg: Normal.     Left foot: Normal.       Feet:     Comments: Full range of motion with plantarflexion, dorsiflexion, inversion and eversion.  Wiggles toes without difficulty.  He is ambulatory with difficulty..  Skin:    General: Skin is warm and dry.     Capillary Refill: Capillary refill takes less than 2 seconds.     Comments:  Small, 2 mm punctate wound to the plantar aspect of his right foot just proximal to the first and second metatarsal.  There is no bleeding or drainage.  There is no surrounding warmth fluctuance or induration. No  cellulitis or abscess.  Neurological:     Mental Status: He is alert.     Sensory: Sensation is intact.     Motor: Motor function is intact.     Coordination: Coordination is intact.     Gait: Gait is intact.     Comments: Intact sensation of bilateral lower extremities.  Ambulatory without difficulty.Equal  5/5 strength bilateral lower extremities      ED Treatments / Results  Labs (all labs ordered  are listed, but only abnormal results are displayed) Labs Reviewed - No data to display  EKG None  Radiology Dg Foot Complete Right  Result Date: 01/30/2019 CLINICAL DATA:  Stepped on nail.  Puncture at base of great toe. EXAM: RIGHT FOOT COMPLETE - 3+ VIEW COMPARISON:  None. FINDINGS: Mild hallux valgus deformity with secondary degenerative changes of the first metatarsophalangeal joint. No acute fracture or dislocation. No radiopaque foreign object. IMPRESSION: No acute osseous abnormality. Electronically Signed   By: Jeronimo Greaves M.D.   On: 01/30/2019 10:31    Procedures Procedures (including critical care time)  Medications Ordered in ED Medications  Tdap (BOOSTRIX) injection 0.5 mL (has no administration in time range)   Initial Impression / Assessment and Plan / ED Course  I have reviewed the triage vital signs and the nursing notes.  Pertinent labs & imaging results that were available during my care of the patient were reviewed by me and considered in my medical decision making (see chart for details).  84 old male appears otherwise well presents for evaluation of puncture wound to his right foot which occurred yesterday.  He is afebrile, nonseptic, non-ill-appearing.  There is a 2 mm puncture wound to plantar aspect of right foot between first and second metatarsal is proximally.  There is no surrounding erythema, warmth cellulitis, fluctuance or induration.  He has no drainage or bleeding.  He seated without difficulty.  No musculoskeletal exam.  Neurovascular intact.   Compartments soft.  Plain film foot shows no evidence of osseous deformity or retained foreign body.  Will DC home with antibiotics and wound recheck in 2 days.  He has no systemic symptoms and does not appear to be actively infected.  Tetanus was updated in the emergency department.  Patient also requesting medication to prevent recurrent abscesses.  He has none currently on exam.  He was previously given chlorhexidine wash however is not use this.  Discussed with patient to use his chlorhexidine wash and follow-up with PCP or dermatology for his recurrent skin infections.    The patient has been appropriately medically screened and/or stabilized in the ED. I have low suspicion for any other emergent medical condition which would require further screening, evaluation or treatment in the ED or require inpatient management.        Final Clinical Impressions(s) / ED Diagnoses   Final diagnoses:  Nail wound of right foot, initial encounter    ED Discharge Orders         Ordered    ciprofloxacin (CIPRO) 500 MG tablet  Every 12 hours     01/30/19 1002           Makiyah Zentz A, PA-C 01/30/19 1037    Alvira Monday, MD 02/02/19 1259

## 2019-02-13 ENCOUNTER — Encounter (HOSPITAL_COMMUNITY): Payer: Self-pay | Admitting: Emergency Medicine

## 2019-02-13 ENCOUNTER — Emergency Department (HOSPITAL_COMMUNITY)
Admission: EM | Admit: 2019-02-13 | Discharge: 2019-02-13 | Disposition: A | Discharge status: Home / Self Care | Payer: Self-pay | Attending: Emergency Medicine | Admitting: Emergency Medicine

## 2019-02-13 ENCOUNTER — Other Ambulatory Visit: Payer: Self-pay

## 2019-02-13 DIAGNOSIS — L0291 Cutaneous abscess, unspecified: Secondary | ICD-10-CM

## 2019-02-13 DIAGNOSIS — Z79899 Other long term (current) drug therapy: Secondary | ICD-10-CM | POA: Insufficient documentation

## 2019-02-13 DIAGNOSIS — I10 Essential (primary) hypertension: Secondary | ICD-10-CM | POA: Insufficient documentation

## 2019-02-13 DIAGNOSIS — F17210 Nicotine dependence, cigarettes, uncomplicated: Secondary | ICD-10-CM | POA: Insufficient documentation

## 2019-02-13 DIAGNOSIS — L02411 Cutaneous abscess of right axilla: Secondary | ICD-10-CM | POA: Insufficient documentation

## 2019-02-13 MED ORDER — CHLORHEXIDINE GLUCONATE 4 % EX LIQD: Freq: Every day | CUTANEOUS | 0 refills | Status: DC | PRN

## 2019-02-13 MED ORDER — LIDOCAINE-EPINEPHRINE (PF) 2 %-1:200000 IJ SOLN
10.0000 mL | Freq: Once | INTRAMUSCULAR | Status: AC
  Administered 2019-02-13: 10 mL
  Filled 2019-02-13: qty 20

## 2019-02-13 NOTE — ED Triage Notes (Signed)
C/o abscess (x2) to R axilla x 1 week.

## 2019-02-13 NOTE — ED Provider Notes (Signed)
[facility] EMERGENCY DEPARTMENT Provider Note   CSN: [id number] Arrival date & time: 02/13/19  1120     History   Chief Complaint Chief Complaint  Patient presents with  . Abscess    HPI Alec Grant is a 43 y.o. male.     HPI  Patient presents today for right axillary lump patient states he has a history of recurrent abscesses.  States he has had multiple incision and drainages in the past.  Has smaller lump in left armpit that is nonpainful and small lump in right groin.  States that right armpit has sharp pain with palpation.  Denies any pain at rest.  Denies any discharge.  States he used to shave his armpits but has not recently.  Denies any medical problems or immunosuppression.    He has been seen last for similar abscesses and then recommended follow-up with dermatology.  States he has not called to make appointment yet but claims he is.  States he has not used Hibiclens in the past.   Past Medical History:  Diagnosis Date  . Chronic bronchitis    pt doesnt remember date  . Hypertension   . Migraines   . Pneumonia   . Pulmonary embolism [facility])     Patient Active Problem List   Diagnosis Date Noted  . Migraines   . External hemorrhoid, thrombosed 05/21/2011  . HTN (hypertension), malignant 03/09/2011  . Pneumonia 03/08/2011    History reviewed. No pertinent surgical history.      Home Medications    Prior to Admission medications   Medication Sig Start Date End Date Taking? Authorizing Provider  acetaminophen (TYLENOL 8 HOUR) 650 MG CR tablet Take 1 tablet (650 mg total) by mouth every 8 (eight) hours as needed for pain. 09/27/17   Kellie Shropshire, PA-C  cephALEXin (KEFLEX) 500 MG capsule Take 1 capsule (500 mg total) by mouth 4 (four) times daily. 01/11/18   Joy, Shawn C, PA-C  chlorhexidine (HIBICLENS) 4 % external liquid Apply topically daily as needed. 02/13/19   Gailen Shelter, PA  ciprofloxacin (CIPRO) 500 MG tablet Take 1  tablet (500 mg total) by mouth every 12 (twelve) hours. 01/30/19   Henderly, Britni A, PA-C  cyclobenzaprine (FLEXERIL) 10 MG tablet Take 1 tablet (10 mg total) by mouth 2 (two) times daily as needed for muscle spasms. 09/27/17   Kellie Shropshire, PA-C  doxycycline (VIBRAMYCIN) 100 MG capsule Take 1 capsule (100 mg total) by mouth 2 (two) times daily. 08/11/18   Hedges, Tinnie Gens, PA-C  hydrochlorothiazide (HYDRODIURIL) 25 MG tablet Take 1 tablet (25 mg total) by mouth daily. 09/19/17   Melene Plan, DO  HYDROcodone-acetaminophen (NORCO/VICODIN) 5-325 MG tablet Take 1 tablet by mouth every 6 (six) hours as needed for severe pain. 01/11/18   Joy, Shawn C, PA-C  ibuprofen (ADVIL) 800 MG tablet Take 1 tablet (800 mg total) by mouth every 8 (eight) hours as needed. 10/17/18   Lawyer, Cristal Deer, PA-C  lisinopril (PRINIVIL,ZESTRIL) 10 MG tablet Take 1 tablet (10 mg total) by mouth daily. 01/21/16   Preston Fleeting, MD  traMADol (ULTRAM) 50 MG tablet Take 1 tablet (50 mg total) by mouth every 6 (six) hours as needed for severe pain. 10/17/18   Charlestine Night, PA-C    Family History Family History  Problem Relation Age of Onset  . Stroke Mother   . Diabetes type II Mother     Social History Social History   Tobacco Use  . Smoking  status: Current Every Day Smoker    Packs/day: 0.50    Years: 15.00    Pack years: 7.50    Types: Cigarettes  . Smokeless tobacco: Never Used  Substance Use Topics  . Alcohol use: Not Currently    Comment: Occassional Use  . Drug use: Yes    Frequency: 4.0 times per week    Types: Marijuana     Allergies   Patient has no known allergies.   Review of Systems Review of Systems  Constitutional: Negative for chills and fever.  HENT: Negative for congestion.   Eyes: Negative for pain.  Respiratory: Negative for cough and shortness of breath.   Cardiovascular: Negative for chest pain and leg swelling.  Gastrointestinal: Negative for abdominal pain and vomiting.   Genitourinary: Negative for dysuria.  Musculoskeletal: Negative for myalgias.  Skin: Negative for rash.       Right axillary left axillary and right-sided inguinal lumps  Neurological: Negative for dizziness and headaches.     Physical Exam Updated Vital Signs BP (!) 160/99 (BP Location: Left Arm)   Pulse 77   Temp 97.7 F (36.5 C) (Oral)   Resp 16   SpO2 100%   Physical Exam Vitals signs and nursing note reviewed.  Constitutional:      General: He is not in acute distress.    Appearance: Normal appearance. He is not ill-appearing.  HENT:     Head: Normocephalic and atraumatic.  Eyes:     General: No scleral icterus.       Right eye: No discharge.        Left eye: No discharge.     Conjunctiva/sclera: Conjunctivae normal.  Pulmonary:     Effort: Pulmonary effort is normal.     Breath sounds: No stridor.  Skin:    Comments: 3 cm cystic lump right axilla with mild tenderness to palpation.  No drainage.  No obvious head.  Smaller 1 cm cystic lump left axilla but is nontender to palpation.  No surrounding erythema cellulitis or tenderness.  No induration.  No fluctuance.  Right inguinal crease with 2 small bumps without induration erythema surrounding cellulitis.  Tender to palpation no fluctuance.  Neurological:     Mental Status: He is alert and oriented to person, place, and time. Mental status is at baseline.      ED Treatments / Results  Labs (all labs ordered are listed, but only abnormal results are displayed) Labs Reviewed - No data to display  EKG None  Radiology No results found.  Procedures .Marland KitchenIncision and Drainage  Date/Time: 02/13/2019 4:39 PM Performed by: Tedd Sias, PA Authorized by: Tedd Sias, PA   Consent:    Consent obtained:  Verbal   Consent given by:  Patient   Risks discussed:  Bleeding, incomplete drainage, pain and damage to other organs   Alternatives discussed:  No treatment Universal protocol:    Procedure  explained and questions answered to patient or proxy's satisfaction: yes     Relevant documents present and verified: yes     Test results available and properly labeled: yes     Imaging studies available: yes     Required blood products, implants, devices, and special equipment available: yes     Site/side marked: yes     Immediately prior to procedure a time out was called: yes     Patient identity confirmed:  Verbally with patient Location:    Type:  Abscess   Size:  3 cm  Location:  Upper extremity   Upper extremity location: Axilla, right. Pre-procedure details:    Skin preparation:  Betadine Anesthesia (see MAR for exact dosages):    Anesthesia method:  Local infiltration   Local anesthetic:  Lidocaine 1% WITH epi Procedure type:    Complexity:  Simple Procedure details:    Incision types:  Single straight   Incision depth:  Subcutaneous   Scalpel blade:  11   Wound management:  Probed and deloculated, irrigated with saline and extensive cleaning   Drainage:  Purulent   Drainage amount:  Moderate Post-procedure details:    Patient tolerance of procedure:  Tolerated well, no immediate complications Comments:     Left wound open with gauze covering   (including critical care time)   Medications Ordered in ED Medications  lidocaine-EPINEPHrine (XYLOCAINE W/EPI) 2 %-1:200000 (PF) injection 10 mL (10 mLs Infiltration Given 02/13/19 1250)     Initial Impression / Assessment and Plan / ED Course  I have reviewed the triage vital signs and the nursing notes.  Pertinent labs & imaging results that were available during my care of the patient were reviewed by me and considered in my medical decision making (see chart for details).        Patient is 43 year old male presenting today with right axillary abscess states that he feels otherwise well.  No systemic signs of infection.  States that he has history of recurrent abscesses.  On my review of EMR patient has been  provided chlorhexidine wash in the past but has not used.  Has been recommended follow-up with dermatology and has not.  Will incise and drain right axillary armpit today.  Incision drainage conducted with minimal drainage.  Area lesion is mostly indurated tissue that is nontender to palpation with no erythema doubt this is acutely infected.  No Indication for antibiotics as this appears to be a cyst rather than a true abscess.  We will discharge with chlorhexidine topical wash and instructed to use this. Recommended FU with dermatology.   Suspect this patient has hidradenitis suppurativa due to recurrence of axillary lesions.    Final Clinical Impressions(s) / ED Diagnoses   Final diagnoses:  Abscess  Abscess of axilla, right    ED Discharge Orders         Ordered    chlorhexidine (HIBICLENS) 4 % external liquid  Daily PRN     02/13/19 1241           Gailen ShelterFondaw, Alek Borges S, GeorgiaPA 02/13/19 1640    Linwood DibblesKnapp, Jon, MD 02/14/19 [phone]

## 2019-02-13 NOTE — Discharge Instructions (Addendum)
Please call Monday morning to make appointment with dermatologist of your choice.  Please use chlorhexidine external liquid for cleaning carpets).

## 2019-02-13 NOTE — ED Notes (Signed)
Patient verbalizes understanding of discharge instructions . Opportunity for questions and answers were provided . Armband removed by staff ,Pt discharged from ED. W/C  offered at D/C  and Declined W/C at D/C and was escorted to lobby by RN.  

## 2019-07-20 ENCOUNTER — Other Ambulatory Visit: Payer: Self-pay

## 2019-07-20 ENCOUNTER — Encounter (HOSPITAL_COMMUNITY): Payer: Self-pay | Admitting: Emergency Medicine

## 2019-07-20 ENCOUNTER — Emergency Department (HOSPITAL_COMMUNITY)
Admission: EM | Admit: 2019-07-20 | Discharge: 2019-07-20 | Disposition: A | Discharge status: Home / Self Care | Payer: Self-pay | Attending: Emergency Medicine | Admitting: Emergency Medicine

## 2019-07-20 DIAGNOSIS — F1290 Cannabis use, unspecified, uncomplicated: Secondary | ICD-10-CM | POA: Insufficient documentation

## 2019-07-20 DIAGNOSIS — Z86711 Personal history of pulmonary embolism: Secondary | ICD-10-CM | POA: Insufficient documentation

## 2019-07-20 DIAGNOSIS — L0291 Cutaneous abscess, unspecified: Secondary | ICD-10-CM

## 2019-07-20 DIAGNOSIS — F17210 Nicotine dependence, cigarettes, uncomplicated: Secondary | ICD-10-CM | POA: Insufficient documentation

## 2019-07-20 DIAGNOSIS — L02414 Cutaneous abscess of left upper limb: Secondary | ICD-10-CM | POA: Insufficient documentation

## 2019-07-20 DIAGNOSIS — I10 Essential (primary) hypertension: Secondary | ICD-10-CM | POA: Insufficient documentation

## 2019-07-20 MED ORDER — HYDROCODONE-ACETAMINOPHEN 5-325 MG PO TABS: 1.0000 | ORAL_TABLET | ORAL | 0 refills | Status: AC | PRN

## 2019-07-20 MED ORDER — MORPHINE SULFATE (PF) 4 MG/ML IV SOLN
4.0000 mg | Freq: Once | INTRAVENOUS | Status: AC
  Administered 2019-07-20: 4 mg via INTRAVENOUS
  Filled 2019-07-20: qty 1

## 2019-07-20 MED ORDER — LIDOCAINE HCL (PF) 1 % IJ SOLN
5.0000 mL | Freq: Once | INTRAMUSCULAR | Status: AC
  Administered 2019-07-20: 5 mL via INTRADERMAL
  Filled 2019-07-20: qty 30

## 2019-07-20 MED ORDER — CLINDAMYCIN PHOSPHATE 600 MG/50ML IV SOLN
600.0000 mg | Freq: Once | INTRAVENOUS | Status: AC
  Administered 2019-07-20: 600 mg via INTRAVENOUS
  Filled 2019-07-20: qty 50

## 2019-07-20 MED ORDER — CLINDAMYCIN HCL 300 MG PO CAPS: 300.0000 mg | ORAL_CAPSULE | Freq: Four times a day (QID) | ORAL | 0 refills | Status: AC

## 2019-07-20 NOTE — Discharge Instructions (Signed)
Begin taking clindamycin as prescribed.  Take hydrocodone as prescribed as needed for pain.  Apply warm compresses as frequently as possible for the next several days.  Return to the emergency department if you develop increased swelling, redness, pain, fever, or other new and concerning symptoms.

## 2019-07-20 NOTE — ED Provider Notes (Signed)
[facility] EMERGENCY DEPARTMENT Provider Note   CSN: [id number] Arrival date & time: 07/20/19  9381     History Chief Complaint  Patient presents with  . Wound Infection    Alec Grant is a 44 y.o. male.  Patient is a 44 year old male with past medical history of hypertension, migraines, prior PE.  He presents today for evaluation of pain and swelling to his left forearm.  This has gotten worse over the past several days.  He denies any fevers or chills.  He believes he may have been bitten by a spider, but does not know for sure.  Pain is worse with movement and palpation.  There are no alleviating factors.  He has tried soaking it with little relief.  The history is provided by the patient.       Past Medical History:  Diagnosis Date  . Chronic bronchitis    pt doesnt remember date  . Hypertension   . Migraines   . Pneumonia   . Pulmonary embolism [facility])     Patient Active Problem List   Diagnosis Date Noted  . Migraines   . External hemorrhoid, thrombosed 05/21/2011  . HTN (hypertension), malignant 03/09/2011  . Pneumonia 03/08/2011    History reviewed. No pertinent surgical history.     Family History  Problem Relation Age of Onset  . Stroke Mother   . Diabetes type II Mother     Social History   Tobacco Use  . Smoking status: Current Every Day Smoker    Packs/day: 0.50    Years: 15.00    Pack years: 7.50    Types: Cigarettes  . Smokeless tobacco: Never Used  Substance Use Topics  . Alcohol use: Not Currently    Comment: Occassional Use  . Drug use: Yes    Frequency: 4.0 times per week    Types: Marijuana    Home Medications Prior to Admission medications   Medication Sig Start Date End Date Taking? Authorizing Provider  acetaminophen (TYLENOL 8 HOUR) 650 MG CR tablet Take 1 tablet (650 mg total) by mouth every 8 (eight) hours as needed for pain. 09/27/17   Kellie Shropshire, PA-C  cephALEXin (KEFLEX) 500 MG capsule  Take 1 capsule (500 mg total) by mouth 4 (four) times daily. 01/11/18   Joy, Shawn C, PA-C  chlorhexidine (HIBICLENS) 4 % external liquid Apply topically daily as needed. 02/13/19   Gailen Shelter, PA  ciprofloxacin (CIPRO) 500 MG tablet Take 1 tablet (500 mg total) by mouth every 12 (twelve) hours. 01/30/19   Henderly, Britni A, PA-C  cyclobenzaprine (FLEXERIL) 10 MG tablet Take 1 tablet (10 mg total) by mouth 2 (two) times daily as needed for muscle spasms. 09/27/17   Kellie Shropshire, PA-C  doxycycline (VIBRAMYCIN) 100 MG capsule Take 1 capsule (100 mg total) by mouth 2 (two) times daily. 08/11/18   Hedges, Tinnie Gens, PA-C  hydrochlorothiazide (HYDRODIURIL) 25 MG tablet Take 1 tablet (25 mg total) by mouth daily. 09/19/17   Melene Plan, DO  HYDROcodone-acetaminophen (NORCO/VICODIN) 5-325 MG tablet Take 1 tablet by mouth every 6 (six) hours as needed for severe pain. 01/11/18   Joy, Shawn C, PA-C  ibuprofen (ADVIL) 800 MG tablet Take 1 tablet (800 mg total) by mouth every 8 (eight) hours as needed. 10/17/18   Lawyer, Cristal Deer, PA-C  lisinopril (PRINIVIL,ZESTRIL) 10 MG tablet Take 1 tablet (10 mg total) by mouth daily. 01/21/16   Preston Fleeting, MD  traMADol Janean Sark) 50 MG tablet Take  1 tablet (50 mg total) by mouth every 6 (six) hours as needed for severe pain. 10/17/18   Dalia Heading, PA-C    Allergies    Patient has no known allergies.  Review of Systems   Review of Systems  All other systems reviewed and are negative.   Physical Exam Updated Vital Signs BP (!) 184/117 (BP Location: Right Arm)   Pulse 75   Temp 98.4 F (36.9 C) (Oral)   Resp 18   SpO2 100%   Physical Exam Vitals and nursing note reviewed.  Constitutional:      General: He is not in acute distress.    Appearance: Normal appearance. He is not ill-appearing, toxic-appearing or diaphoretic.  HENT:     Head: Normocephalic and atraumatic.  Pulmonary:     Effort: Pulmonary effort is normal.  Musculoskeletal:      Cervical back: Normal range of motion.  Skin:    General: Skin is warm and dry.     Comments: There is an area of swelling, erythema, and tenderness over the ulnar aspect of the left forearm.  Ulnar and radial pulses are palpable and he is able to flex and extend his fingers without discomfort.  Neurological:     Mental Status: He is alert.     ED Results / Procedures / Treatments   Labs (all labs ordered are listed, but only abnormal results are displayed) Labs Reviewed - No data to display  EKG None  Radiology No results found.  Procedures Procedures (including critical care time)  Medications Ordered in ED Medications  morphine 4 MG/ML injection 4 mg (has no administration in time range)  clindamycin (CLEOCIN) IVPB 600 mg (has no administration in time range)  lidocaine (PF) (XYLOCAINE) 1 % injection 5 mL (has no administration in time range)    ED Course  I have reviewed the triage vital signs and the nursing notes.  Pertinent labs & imaging results that were available during my care of the patient were reviewed by me and considered in my medical decision making (see chart for details).    MDM Rules/Calculators/A&P  Patient presenting with complaints of left arm pain and swelling from an abscess.  This area was incised and drained.  Patient given IV clindamycin.  He will be continued on clindamycin, pain medicine, warm compresses, and as needed return.  INCISION AND DRAINAGE Performed by: Veryl Speak Consent: Verbal consent obtained. Risks and benefits: risks, benefits and alternatives were discussed Type: abscess  Body area: left forearm  Anesthesia: local infiltration  Incision was made with a scalpel.  Local anesthetic: lidocaine 1% without epinephrine  Anesthetic total: 2 ml  Complexity: complex Blunt dissection to break up loculations  Drainage: purulent  Drainage amount: moderate  Packing material: none placed  Patient tolerance: Patient  tolerated the procedure well with no immediate complications.     Final Clinical Impression(s) / ED Diagnoses Final diagnoses:  None    Rx / DC Orders ED Discharge Orders    None       Veryl Speak, MD 07/20/19 1102

## 2019-07-20 NOTE — ED Triage Notes (Signed)
Pt states he noticed a small bump on his left back side of forearm last Thursday and it has gradually become more swollen and painful.

## 2019-11-04 ENCOUNTER — Encounter (HOSPITAL_COMMUNITY): Payer: Self-pay

## 2019-11-04 ENCOUNTER — Emergency Department (HOSPITAL_COMMUNITY)
Admission: EM | Admit: 2019-11-04 | Discharge: 2019-11-04 | Disposition: A | Discharge status: Home / Self Care | Payer: Self-pay | Attending: Emergency Medicine | Admitting: Emergency Medicine

## 2019-11-04 ENCOUNTER — Other Ambulatory Visit: Payer: Self-pay

## 2019-11-04 ENCOUNTER — Emergency Department (HOSPITAL_COMMUNITY): Payer: Self-pay

## 2019-11-04 DIAGNOSIS — J101 Influenza due to other identified influenza virus with other respiratory manifestations: Secondary | ICD-10-CM | POA: Insufficient documentation

## 2019-11-04 DIAGNOSIS — Z79899 Other long term (current) drug therapy: Secondary | ICD-10-CM | POA: Insufficient documentation

## 2019-11-04 DIAGNOSIS — F17210 Nicotine dependence, cigarettes, uncomplicated: Secondary | ICD-10-CM | POA: Insufficient documentation

## 2019-11-04 DIAGNOSIS — I10 Essential (primary) hypertension: Secondary | ICD-10-CM | POA: Insufficient documentation

## 2019-11-04 DIAGNOSIS — J111 Influenza due to unidentified influenza virus with other respiratory manifestations: Secondary | ICD-10-CM

## 2019-11-04 DIAGNOSIS — Z20822 Contact with and (suspected) exposure to covid-19: Secondary | ICD-10-CM | POA: Insufficient documentation

## 2019-11-04 LAB — LACTIC ACID, PLASMA
Lactic Acid, Venous: 0.8 mmol/L (ref 0.5–1.9)
Lactic Acid, Venous: 1.4 mmol/L (ref 0.5–1.9)

## 2019-11-04 LAB — COMPREHENSIVE METABOLIC PANEL
ALT: 12 U/L (ref 0–44)
AST: 18 U/L (ref 15–41)
Albumin: 4 g/dL (ref 3.5–5.0)
Alkaline Phosphatase: 64 U/L (ref 38–126)
Anion gap: 8 (ref 5–15)
BUN: 9 mg/dL (ref 6–20)
CO2: 28 mmol/L (ref 22–32)
Calcium: 8.7 mg/dL — ABNORMAL LOW (ref 8.9–10.3)
Chloride: 98 mmol/L (ref 98–111)
Creatinine, Ser: 1.47 mg/dL — ABNORMAL HIGH (ref 0.61–1.24)
GFR calc Af Amer: >60 mL/min (ref >60)
GFR calc non Af Amer: 57 mL/min — ABNORMAL LOW (ref >60)
Glucose, Bld: 105 mg/dL — ABNORMAL HIGH (ref 70–99)
Potassium: 3.2 mmol/L — ABNORMAL LOW (ref 3.5–5.1)
Sodium: 134 mmol/L — ABNORMAL LOW (ref 135–145)
Total Bilirubin: 0.5 mg/dL (ref 0.3–1.2)
Total Protein: 7.3 g/dL (ref 6.5–8.1)

## 2019-11-04 LAB — CBC WITH DIFFERENTIAL/PLATELET
Abs Immature Granulocytes: 0.02 10*3/uL (ref 0.00–0.07)
Basophils Absolute: 0 10*3/uL (ref 0.0–0.1)
Basophils Relative: 0 %
Eosinophils Absolute: 0 10*3/uL (ref 0.0–0.5)
Eosinophils Relative: 0 %
HCT: 40 % (ref 39.0–52.0)
Hemoglobin: 13.4 g/dL (ref 13.0–17.0)
Immature Granulocytes: 0 %
Lymphocytes Relative: 23 %
Lymphs Abs: 1.7 10*3/uL (ref 0.7–4.0)
MCH: 30.3 pg (ref 26.0–34.0)
MCHC: 33.5 g/dL (ref 30.0–36.0)
MCV: 90.5 fL (ref 80.0–100.0)
Monocytes Absolute: 0.7 10*3/uL (ref 0.1–1.0)
Monocytes Relative: 10 %
Neutro Abs: 4.9 10*3/uL (ref 1.7–7.7)
Neutrophils Relative %: 67 %
Platelets: 147 10*3/uL — ABNORMAL LOW (ref 150–400)
RBC: 4.42 MIL/uL (ref 4.22–5.81)
RDW: 13 % (ref 11.5–15.5)
WBC: 7.4 10*3/uL (ref 4.0–10.5)
nRBC: 0 % (ref 0.0–0.2)

## 2019-11-04 LAB — SARS CORONAVIRUS 2 BY RT PCR (HOSPITAL ORDER, PERFORMED IN ~~LOC~~ HOSPITAL LAB): SARS Coronavirus 2: NEGATIVE

## 2019-11-04 MED ORDER — LISINOPRIL 10 MG PO TABS
10.0000 mg | ORAL_TABLET | Freq: Once | ORAL | Status: AC
  Administered 2019-11-04: 10 mg via ORAL
  Filled 2019-11-04: qty 1

## 2019-11-04 MED ORDER — ACETAMINOPHEN 325 MG PO TABS
650.0000 mg | ORAL_TABLET | Freq: Once | ORAL | Status: AC | PRN
  Administered 2019-11-04: 650 mg via ORAL
  Filled 2019-11-04 (×2): qty 2

## 2019-11-04 MED ORDER — HYDROCHLOROTHIAZIDE 25 MG PO TABS: 25.0000 mg | ORAL_TABLET | Freq: Every day | ORAL | 1 refills | Status: AC

## 2019-11-04 MED ORDER — KETOROLAC TROMETHAMINE 60 MG/2ML IM SOLN
60.0000 mg | Freq: Once | INTRAMUSCULAR | Status: AC
  Administered 2019-11-04: 60 mg via INTRAMUSCULAR
  Filled 2019-11-04: qty 2

## 2019-11-04 MED ORDER — POTASSIUM CHLORIDE CRYS ER 20 MEQ PO TBCR
40.0000 meq | EXTENDED_RELEASE_TABLET | Freq: Once | ORAL | Status: AC
  Administered 2019-11-04: 40 meq via ORAL
  Filled 2019-11-04: qty 2

## 2019-11-04 NOTE — Discharge Instructions (Addendum)
Your collection of symptoms is suggestive of viral infection.  Please read the attachment on ways to manage your symptoms.  Your potassium was found to be low today.  Please eat potassium rich foods such as potatoes and bananas.  You will need to have a laboratory recheck to ensure correction of your electrolyte derangement.  I have prescribed you HCTZ which I would like for you to take for your elevated blood pressure.  Please recall our conversation about the long-term effects of uncontrolled high blood pressure.  It is important that you get established with a primary care provider and manage your elevated blood pressures.  I would like for you to check your temperature regularly at home and take Tylenol or ibuprofen as needed for fever control.  Ibuprofen should also help with your generalized body aches.  It is important that you drink plenty of fluids to avoid dehydration.  Return to the ED or seek immediate medical attention should you experience any new or worsening symptoms.

## 2019-11-04 NOTE — ED Triage Notes (Signed)
Pt presents with c/o generalized body aches. Pt reports that he also has a fever. Pt reports these symptoms started yesterday.

## 2019-11-04 NOTE — ED Provider Notes (Addendum)
[facility]-EMERGENCY DEPT Provider Note   CSN: [id number] Arrival date & time: 11/04/19  1316     History Chief Complaint  Patient presents with  . Generalized Body Aches  . Fever    Alec Grant is a 44 y.o. male with PMH significant for HTN, bronchitis, pulmonary embolism presents the ED with a 1 day history of fevers and generalized body aches.  Patient is noted to be febrile to 103 F in triage.  On my examination, patient states that yesterday he developed generalized body aches which has continued to persist today.  He states that he has been febrile at home.  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  No obvious sick contacts.  He states that he received the first dose of his COVID-19 vaccination a couple of weeks ago, but is not fully vaccinated.  Patient states that today he has had diminished appetite and over the course of the past 24 hours he has had 4-5 episodes of loose, nonbloody stools.  He also endorses mild congestion or cough, but states that that is typical for him and he endorses a history of chronic bronchitis.  Patient denies any headache or dizziness, chest pain or difficulty breathing, abdominal pain, nausea or vomiting, dysuria or increased urinary frequency, hematuria, or other symptoms.  HPI     Past Medical History:  Diagnosis Date  . Chronic bronchitis    pt doesnt remember date  . Hypertension   . Migraines   . Pneumonia   . Pulmonary embolism [facility])     Patient Active Problem List   Diagnosis Date Noted  . Migraines   . External hemorrhoid, thrombosed 05/21/2011  . HTN (hypertension), malignant 03/09/2011  . Pneumonia 03/08/2011    History reviewed. No pertinent surgical history.     Family History  Problem Relation Age of Onset  . Stroke Mother   . Diabetes type II Mother     Social History   Tobacco Use  . Smoking status: Current Every Day Smoker    Packs/day: 0.50    Years: 15.00    Pack  years: 7.50    Types: Cigarettes  . Smokeless tobacco: Never Used  Vaping Use  . Vaping Use: Never used  Substance Use Topics  . Alcohol use: Not Currently    Comment: Occassional Use  . Drug use: Yes    Frequency: 4.0 times per week    Types: Marijuana    Home Medications Prior to Admission medications   Medication Sig Start Date End Date Taking? Authorizing Provider  acetaminophen (TYLENOL 8 HOUR) 650 MG CR tablet Take 1 tablet (650 mg total) by mouth every 8 (eight) hours as needed for pain. 09/27/17   Kellie Shropshire, PA-C  clindamycin (CLEOCIN) 300 MG capsule Take 1 capsule (300 mg total) by mouth 4 (four) times daily. X 7 days 07/20/19   Geoffery Lyons, MD  hydrochlorothiazide (HYDRODIURIL) 25 MG tablet Take 1 tablet (25 mg total) by mouth daily. 11/04/19   Lorelee New, PA-C  HYDROcodone-acetaminophen (NORCO) 5-325 MG tablet Take 1-2 tablets by mouth every 4 (four) hours as needed. 07/20/19   Geoffery Lyons, MD  ibuprofen (ADVIL) 800 MG tablet Take 1 tablet (800 mg total) by mouth every 8 (eight) hours as needed. 10/17/18   Lawyer, Cristal Deer, PA-C  lisinopril (PRINIVIL,ZESTRIL) 10 MG tablet Take 1 tablet (10 mg total) by mouth daily. Patient not taking: Reported on 07/20/2019 01/21/16   Preston Fleeting, MD  traMADol (  ULTRAM) 50 MG tablet Take 1 tablet (50 mg total) by mouth every 6 (six) hours as needed for severe pain. Patient not taking: Reported on 07/20/2019 10/17/18   Charlestine NightLawyer, Aqib, PA-C    Allergies    Patient has no known allergies.  Review of Systems   Review of Systems  All other systems reviewed and are negative.   Physical Exam Updated Vital Signs BP (!) 191/123   Pulse 67   Temp 98.7 F (37.1 C) (Oral)   Resp 18   Ht 6\' 3"  (1.905 m)   Wt 82.9 kg   SpO2 100%   BMI 22.85 kg/m   Physical Exam Vitals and nursing note reviewed. Exam conducted with a chaperone present.  Constitutional:      Appearance: He is ill-appearing.  HENT:     Head:  Normocephalic and atraumatic.  Eyes:     General: No scleral icterus.    Conjunctiva/sclera: Conjunctivae normal.  Cardiovascular:     Rate and Rhythm: Normal rate and regular rhythm.  Pulmonary:     Effort: Pulmonary effort is normal. No respiratory distress.     Breath sounds: Normal breath sounds. No wheezing or rales.     Comments: No increased work of breathing or abnormal breath sounds. Abdominal:     General: Abdomen is flat. There is no distension.     Palpations: Abdomen is soft.     Tenderness: There is no abdominal tenderness.     Comments: No areas of tenderness.  No guarding.  Soft, nondistended.  Normoactive bowel sounds.  Musculoskeletal:     Cervical back: Normal range of motion. No rigidity.     Right lower leg: No edema.     Left lower leg: No edema.  Skin:    General: Skin is dry.     Capillary Refill: Capillary refill takes less than 2 seconds.     Comments: No rashes or areas concerning for cellulitis.  Neurological:     Mental Status: He is alert.     GCS: GCS eye subscore is 4. GCS verbal subscore is 5. GCS motor subscore is 6.  Psychiatric:        Mood and Affect: Mood normal.        Behavior: Behavior normal.        Thought Content: Thought content normal.     ED Results / Procedures / Treatments   Labs (all labs ordered are listed, but only abnormal results are displayed) Labs Reviewed  CBC WITH DIFFERENTIAL/PLATELET - Abnormal; Notable for the following components:      Result Value   Platelets 147 (*)    All other components within normal limits  COMPREHENSIVE METABOLIC PANEL - Abnormal; Notable for the following components:   Sodium 134 (*)    Potassium 3.2 (*)    Glucose, Bld 105 (*)    Creatinine, Ser 1.47 (*)    Calcium 8.7 (*)    GFR calc non Af Amer 57 (*)    All other components within normal limits  SARS CORONAVIRUS 2 BY RT PCR (HOSPITAL ORDER, PERFORMED IN [facility])  CULTURE, BLOOD (ROUTINE X 2)  CULTURE, BLOOD  (ROUTINE X 2)  LACTIC ACID, PLASMA  LACTIC ACID, PLASMA    EKG None  Radiology DG Chest 2 View  Result Date: 11/04/2019 CLINICAL DATA:  Cough and congestion with fevers EXAM: CHEST - 2 VIEW COMPARISON:  03/26/2018 FINDINGS: Cardiac shadow is within normal limits. The lungs are well aerated bilaterally. No  focal infiltrate or sizable effusion is seen. No acute bony abnormality is noted. IMPRESSION: No acute abnormality noted. Electronically Signed   By: Alcide Clever M.D.   On: 11/04/2019 15:51    Procedures Procedures (including critical care time)  Medications Ordered in ED Medications  ketorolac (TORADOL) injection 60 mg (has no administration in time range)  acetaminophen (TYLENOL) tablet 650 mg (650 mg Oral Given 11/04/19 1725)  potassium chloride SA (KLOR-CON) CR tablet 40 mEq (40 mEq Oral Given 11/04/19 1934)  lisinopril (ZESTRIL) tablet 10 mg (10 mg Oral Given 11/04/19 1934)    ED Course  I have reviewed the triage vital signs and the nursing notes.  Pertinent labs & imaging results that were available during my care of the patient were reviewed by me and considered in my medical decision making (see chart for details).    MDM Rules/Calculators/A&P                          Patient's laboratory work-up is reassuring.  He has a mild CKD which is consistent with baseline.  He was hypokalemic to 3.2, likely in the setting of loose stools.  Will replenish with 40 mEq K-Dur here in the ER.  No leukocytosis concerning for infection.  Remainder of laboratory work-up is unremarkable.  Negative for COVID-19 testing and lactic acid WNL.  After administration of Tylenol here in the ED, patient feels much improved and no longer has a fever.  Patient was found to be hypertensive here in the ED and states that he has not taken any of his previously prescribed antihypertensive medications.  He does not currently have a primary care provider and needs to get established.  Will refer to [facility].  While patient denies any obvious sick contacts, his collection of symptoms are suggestive of a viral communicable disease.  He is endorsing mild congestion and mild cough in the setting of his body aches, loose stools, and fever.  Will recommend over-the-counter conservative management for symptomatic relief.  Emphasized the importance of increased oral hydration to avoid dehydration.  We will also discharge patient home with course of HCTZ as he has been prescribed in the past.  While he reports that lisinopril was well-tolerated, would prefer to avoid risk for angioedema given poor outpatient follow-up.  Emphasized the importance of potassium rich foods as this could further lower his potassium levels.  Plain films obtained of chest were personally reviewed and demonstrate no acute cardiopulmonary findings.  His physical exam is entirely unremarkable and he denies any other complaints.  Do not feel that UA is warranted given lack of symptoms.  Patient with elevated BP today. Unlikely to be related to CC and patient is without symptoms concerning for end organ damage. I discussed with patient their elevated blood pressure and need for close outpatient management of their hypertension. Patient counseled on long-term effects of elevated BP including kidney damage, vascular damage, retinopathy, and risk of stroke and other dangerous outcomes. Patient understanding of close PCP follow up.  Strict ED return precautions discussed.  All of the evaluation and work-up results were discussed with the patient and any family at bedside.  Patient and/or family were informed that while patient is appropriate for discharge at this time, some medical emergencies may only develop or become detectable after a period of time.  I specifically instructed patient and/or family to return to return to the ED or seek immediate medical  attention for any new or worsening symptoms.  They were  provided opportunity to ask any additional questions and have none at this time.  Prior to discharge patient is feeling well, agreeable with plan for discharge home.  They have expressed understanding of verbal discharge instructions as well as return precautions and are agreeable to the plan.   Johnluke Haugen was evaluated in Emergency Department on 11/04/2019 for the symptoms described in the history of present illness. He was evaluated in the context of the global COVID-19 pandemic, which necessitated consideration that the patient might be at risk for infection with the SARS-CoV-2 virus that causes COVID-19. Institutional protocols and algorithms that pertain to the evaluation of patients at risk for COVID-19 are in a state of rapid change based on information released by regulatory bodies including the CDC and federal and state organizations. These policies and algorithms were followed during the patient's care in the ED.  Final Clinical Impression(s) / ED Diagnoses Final diagnoses:  Influenza-like illness    Rx / DC Orders ED Discharge Orders         Ordered    hydrochlorothiazide (HYDRODIURIL) 25 MG tablet  Daily        11/04/19 1952           Lorelee New, PA-C 11/04/19 1952    Lorelee New, PA-C 11/04/19 2001    Mancel Bale, MD 11/09/19 2019

## 2019-11-09 LAB — CULTURE, BLOOD (ROUTINE X 2)
Culture: NO GROWTH
Special Requests: ADEQUATE

## 2019-12-05 ENCOUNTER — Other Ambulatory Visit: Payer: Self-pay

## 2019-12-05 ENCOUNTER — Emergency Department (HOSPITAL_BASED_OUTPATIENT_CLINIC_OR_DEPARTMENT_OTHER)
Admission: EM | Admit: 2019-12-05 | Discharge: 2019-12-05 | Disposition: A | Discharge status: Home / Self Care | Payer: Self-pay | Attending: Emergency Medicine | Admitting: Emergency Medicine

## 2019-12-05 ENCOUNTER — Encounter (HOSPITAL_BASED_OUTPATIENT_CLINIC_OR_DEPARTMENT_OTHER): Payer: Self-pay | Admitting: Emergency Medicine

## 2019-12-05 DIAGNOSIS — Y939 Activity, unspecified: Secondary | ICD-10-CM | POA: Insufficient documentation

## 2019-12-05 DIAGNOSIS — Z5321 Procedure and treatment not carried out due to patient leaving prior to being seen by health care provider: Secondary | ICD-10-CM | POA: Insufficient documentation

## 2019-12-05 DIAGNOSIS — Y990 Civilian activity done for income or pay: Secondary | ICD-10-CM | POA: Insufficient documentation

## 2019-12-05 DIAGNOSIS — X58XXXA Exposure to other specified factors, initial encounter: Secondary | ICD-10-CM | POA: Insufficient documentation

## 2019-12-05 DIAGNOSIS — S99922A Unspecified injury of left foot, initial encounter: Secondary | ICD-10-CM | POA: Insufficient documentation

## 2019-12-05 DIAGNOSIS — Y9289 Other specified places as the place of occurrence of the external cause: Secondary | ICD-10-CM | POA: Insufficient documentation

## 2019-12-05 MED ORDER — OXYCODONE-ACETAMINOPHEN 5-325 MG PO TABS
1.0000 | ORAL_TABLET | ORAL | Status: DC | PRN
  Administered 2019-12-05: 1 via ORAL
  Filled 2019-12-05: qty 1

## 2019-12-05 NOTE — ED Notes (Signed)
Still not in the lobby.  Removed from board at this time.

## 2019-12-05 NOTE — ED Notes (Signed)
Called to take to room  No response from lobby  

## 2019-12-05 NOTE — ED Triage Notes (Signed)
Reports he was cutting metal at work a week ago Saturday when embers went into his left boot.  Wound noted to outer left foot.  Denies any drainage.  Has been keeping it wrapped.  Reports he's been working on it all week.  Some swelling noted.

## 2020-01-01 ENCOUNTER — Emergency Department (HOSPITAL_COMMUNITY)
Admission: EM | Admit: 2020-01-01 | Discharge: 2020-01-01 | Disposition: A | Discharge status: Home / Self Care | Payer: Self-pay | Attending: Emergency Medicine | Admitting: Emergency Medicine

## 2020-01-01 ENCOUNTER — Encounter (HOSPITAL_COMMUNITY): Payer: Self-pay | Admitting: Emergency Medicine

## 2020-01-01 ENCOUNTER — Other Ambulatory Visit: Payer: Self-pay

## 2020-01-01 DIAGNOSIS — L732 Hidradenitis suppurativa: Secondary | ICD-10-CM | POA: Insufficient documentation

## 2020-01-01 DIAGNOSIS — F17210 Nicotine dependence, cigarettes, uncomplicated: Secondary | ICD-10-CM | POA: Insufficient documentation

## 2020-01-01 DIAGNOSIS — L02214 Cutaneous abscess of groin: Secondary | ICD-10-CM | POA: Insufficient documentation

## 2020-01-01 DIAGNOSIS — Z79899 Other long term (current) drug therapy: Secondary | ICD-10-CM | POA: Insufficient documentation

## 2020-01-01 DIAGNOSIS — I10 Essential (primary) hypertension: Secondary | ICD-10-CM | POA: Insufficient documentation

## 2020-01-01 MED ORDER — LIDOCAINE-EPINEPHRINE 1 %-1:100000 IJ SOLN
20.0000 mL | Freq: Once | INTRAMUSCULAR | Status: AC
  Administered 2020-01-01: 20 mL
  Filled 2020-01-01: qty 1

## 2020-01-01 MED ORDER — IBUPROFEN 600 MG PO TABS: 600.0000 mg | ORAL_TABLET | Freq: Four times a day (QID) | ORAL | 0 refills | Status: AC | PRN

## 2020-01-01 MED ORDER — DOXYCYCLINE HYCLATE 100 MG PO CAPS: 100.0000 mg | ORAL_CAPSULE | Freq: Two times a day (BID) | ORAL | 0 refills | Status: AC

## 2020-01-01 NOTE — Discharge Instructions (Addendum)
Please apply warm compress to affected area several times daily to help encourage with drainage.  Remove packing in 2 days.  Take antibiotic for the full duration but avoid prolonged exposure to the sun.  Take antibiotic with food.  Take ibuprofen as needed for pain.  Return if you have any concern.

## 2020-01-01 NOTE — ED Provider Notes (Signed)
[facility] EMERGENCY DEPARTMENT Provider Note   CSN: [id number] Arrival date & time: 01/01/20  1227     History Chief Complaint  Patient presents with  . Abscess    Alec Grant is a 44 y.o. male.  The history is provided by the patient. No language interpreter was used.  Abscess    44 year old male significant history of recurrent abscess presenting complaining of skin abscesses.  Patient report for the past week and a half he developed multiple abscesses in both of his armpit as well as his groin region.  He described pain as a achy throbbing sharp pain moderate to severe worse with palpation or with movement.  No associated fever no sore throat or joint pain.  No specific treatment tried.  He is up-to-date with tetanus.  He has had these in the past.  Past Medical History:  Diagnosis Date  . Chronic bronchitis    pt doesnt remember date  . Hypertension   . Migraines   . Pneumonia   . Pulmonary embolism [facility])     Patient Active Problem List   Diagnosis Date Noted  . Migraines   . External hemorrhoid, thrombosed 05/21/2011  . HTN (hypertension), malignant 03/09/2011  . Pneumonia 03/08/2011    History reviewed. No pertinent surgical history.     Family History  Problem Relation Age of Onset  . Stroke Mother   . Diabetes type II Mother     Social History   Tobacco Use  . Smoking status: Current Every Day Smoker    Packs/day: 0.50    Years: 15.00    Pack years: 7.50    Types: Cigarettes  . Smokeless tobacco: Never Used  Vaping Use  . Vaping Use: Never used  Substance Use Topics  . Alcohol use: Not Currently    Comment: Occassional Use  . Drug use: Yes    Frequency: 4.0 times per week    Types: Marijuana    Home Medications Prior to Admission medications   Medication Sig Start Date End Date Taking? Authorizing Provider  acetaminophen (TYLENOL 8 HOUR) 650 MG CR tablet Take 1 tablet (650 mg total) by mouth every 8 (eight)  hours as needed for pain. 09/27/17   Kellie Shropshire, PA-C  clindamycin (CLEOCIN) 300 MG capsule Take 1 capsule (300 mg total) by mouth 4 (four) times daily. X 7 days 07/20/19   Geoffery Lyons, MD  hydrochlorothiazide (HYDRODIURIL) 25 MG tablet Take 1 tablet (25 mg total) by mouth daily. 11/04/19   Lorelee New, PA-C  HYDROcodone-acetaminophen (NORCO) 5-325 MG tablet Take 1-2 tablets by mouth every 4 (four) hours as needed. 07/20/19   Geoffery Lyons, MD  ibuprofen (ADVIL) 800 MG tablet Take 1 tablet (800 mg total) by mouth every 8 (eight) hours as needed. 10/17/18   Lawyer, Cristal Deer, PA-C  lisinopril (PRINIVIL,ZESTRIL) 10 MG tablet Take 1 tablet (10 mg total) by mouth daily. Patient not taking: Reported on 07/20/2019 01/21/16   Preston Fleeting, MD  traMADol (ULTRAM) 50 MG tablet Take 1 tablet (50 mg total) by mouth every 6 (six) hours as needed for severe pain. Patient not taking: Reported on 07/20/2019 10/17/18   Charlestine Night, PA-C    Allergies    Patient has no known allergies.  Review of Systems   Review of Systems  All other systems reviewed and are negative.   Physical Exam Updated Vital Signs BP (!) 174/100 (BP Location: Right Arm)   Pulse 69   Temp 98.4 F (36.9  C) (Oral)   Resp 16   SpO2 100%   Physical Exam Vitals and nursing note reviewed.  Constitutional:      General: He is not in acute distress.    Appearance: He is well-developed.  HENT:     Head: Atraumatic.  Eyes:     Conjunctiva/sclera: Conjunctivae normal.  Musculoskeletal:     Cervical back: Neck supple.  Skin:    Findings: No rash.     Comments: Multiple localized area of induration and fluctuant noted to bilateral axillary folds right greater than left with exquisite tenderness to palpation and mild surrounding skin erythema.  Small abscess noted to right inguinal region.  Neurological:     Mental Status: He is alert.     ED Results / Procedures / Treatments   Labs (all labs ordered are  listed, but only abnormal results are displayed) Labs Reviewed - No data to display  EKG None  Radiology No results found.  Procedures .Marland KitchenIncision and Drainage  Date/Time: 01/01/2020 2:07 PM Performed by: Fayrene Helper, PA-C Authorized by: Fayrene Helper, PA-C   Consent:    Consent obtained:  Verbal   Consent given by:  Patient   Risks discussed:  Bleeding, incomplete drainage, pain and damage to other organs   Alternatives discussed:  No treatment Universal protocol:    Procedure explained and questions answered to patient or proxy's satisfaction: yes     Relevant documents present and verified: yes     Test results available and properly labeled: yes     Imaging studies available: yes     Required blood products, implants, devices, and special equipment available: yes     Site/side marked: yes     Immediately prior to procedure a time out was called: yes     Patient identity confirmed:  Verbally with patient Location:    Type:  Abscess   Size:  4cm   Location:  Upper extremity   Upper extremity location: right axillary fold. Pre-procedure details:    Skin preparation:  Betadine Anesthesia (see MAR for exact dosages):    Anesthesia method:  Local infiltration   Local anesthetic:  Lidocaine 1% WITH epi Procedure type:    Complexity:  Complex Procedure details:    Incision types:  Single straight   Incision depth:  Subcutaneous   Scalpel blade:  11   Wound management:  Probed and deloculated, irrigated with saline and extensive cleaning   Drainage:  Purulent   Drainage amount:  Moderate   Packing materials:  1/4 in gauze Post-procedure details:    Patient tolerance of procedure:  Tolerated well, no immediate complications Comments:     There are 3 abscesses in the right axillary fold that was incised and drained by me.   (including critical care time)  Medications Ordered in ED Medications  lidocaine-EPINEPHrine (XYLOCAINE W/EPI) 1 %-1:100000 (with pres) injection  20 mL (20 mLs Infiltration Given by Other 01/01/20 1352)    ED Course  I have reviewed the triage vital signs and the nursing notes.  Pertinent labs & imaging results that were available during my care of the patient were reviewed by me and considered in my medical decision making (see chart for details).    MDM Rules/Calculators/A&P                          BP (!) 174/100 (BP Location: Right Arm)   Pulse 69   Temp 98.4 F (36.9 C) (Oral)  Resp 16   SpO2 100%   Final Clinical Impression(s) / ED Diagnoses Final diagnoses:  Hidradenitis suppurativa of right axilla  Suppurative hidradenitis    Rx / DC Orders ED Discharge Orders         Ordered    ibuprofen (ADVIL) 600 MG tablet  Every 6 hours PRN        01/01/20 1413    doxycycline (VIBRAMYCIN) 100 MG capsule  2 times daily        01/01/20 1413         1:11 PM Patient with multiple abscesses to axillary folds and inguinal fold suggestive hidradenitis suppurativa.  Some of the abscess are large enough that it will require incision and drainage.  He is up-to-date with tetanus.  He will benefit from prolonged antibiotic use as well.  Will perform I&D.  2:08 PM I was able to perform incision and drainage of 3 subcutaneous abscesses in the right axillary fold.  Packing was placed in the largest one.  Patient will be discharging home with antibiotic for the next month.  Packing removal in 2 days which can be done outpatient.  Return precaution discussed.   Fayrene Helper, PA-C 01/01/20 1414    Tilden Fossa, MD 01/01/20 2121

## 2020-01-01 NOTE — ED Notes (Signed)
Patient verbalizes understanding of discharge instructions. Opportunity for questioning and answers were provided. Armband removed by staff, pt discharged from ED to home. Ambulatory, A&Ox4

## 2020-01-01 NOTE — ED Triage Notes (Addendum)
Pt reports multiple abscess (bilateral axilla and groin) x 2 weeks.  States he recently finished taking an antibiotic.  History of same.

## 2020-04-16 ENCOUNTER — Other Ambulatory Visit: Payer: Self-pay

## 2020-04-16 ENCOUNTER — Emergency Department (HOSPITAL_COMMUNITY)
Admission: EM | Admit: 2020-04-16 | Discharge: 2020-04-17 | Disposition: A | Discharge status: Home / Self Care | Payer: Self-pay | Attending: Emergency Medicine | Admitting: Emergency Medicine

## 2020-04-16 ENCOUNTER — Encounter (HOSPITAL_COMMUNITY): Payer: Self-pay | Admitting: Emergency Medicine

## 2020-04-16 DIAGNOSIS — R202 Paresthesia of skin: Secondary | ICD-10-CM | POA: Insufficient documentation

## 2020-04-16 DIAGNOSIS — Z5321 Procedure and treatment not carried out due to patient leaving prior to being seen by health care provider: Secondary | ICD-10-CM | POA: Insufficient documentation

## 2020-04-16 DIAGNOSIS — R42 Dizziness and giddiness: Secondary | ICD-10-CM | POA: Insufficient documentation

## 2020-04-16 LAB — URINALYSIS, ROUTINE W REFLEX MICROSCOPIC
Bacteria, UA: NONE SEEN
Bilirubin Urine: NEGATIVE
Glucose, UA: NEGATIVE mg/dL
Hgb urine dipstick: NEGATIVE
Ketones, ur: NEGATIVE mg/dL
Leukocytes,Ua: NEGATIVE
Nitrite: NEGATIVE
Protein, ur: 30 mg/dL — AB
Specific Gravity, Urine: 1.031 — ABNORMAL HIGH (ref 1.005–1.030)
pH: 5 (ref 5.0–8.0)

## 2020-04-16 LAB — BASIC METABOLIC PANEL
Anion gap: 9 (ref 5–15)
BUN: 15 mg/dL (ref 6–20)
CO2: 25 mmol/L (ref 22–32)
Calcium: 8.7 mg/dL — ABNORMAL LOW (ref 8.9–10.3)
Chloride: 105 mmol/L (ref 98–111)
Creatinine, Ser: 1.52 mg/dL — ABNORMAL HIGH (ref 0.61–1.24)
GFR, Estimated: 57 mL/min — ABNORMAL LOW (ref >60)
Glucose, Bld: 91 mg/dL (ref 70–99)
Potassium: 4 mmol/L (ref 3.5–5.1)
Sodium: 139 mmol/L (ref 135–145)

## 2020-04-16 LAB — CBC
HCT: 40.2 % (ref 39.0–52.0)
Hemoglobin: 13.6 g/dL (ref 13.0–17.0)
MCH: 30.4 pg (ref 26.0–34.0)
MCHC: 33.8 g/dL (ref 30.0–36.0)
MCV: 89.7 fL (ref 80.0–100.0)
Platelets: 151 10*3/uL (ref 150–400)
RBC: 4.48 MIL/uL (ref 4.22–5.81)
RDW: 13.4 % (ref 11.5–15.5)
WBC: 4.9 10*3/uL (ref 4.0–10.5)
nRBC: 0 % (ref 0.0–0.2)

## 2020-04-16 MED ORDER — IBUPROFEN 400 MG PO TABS
400.0000 mg | ORAL_TABLET | Freq: Once | ORAL | Status: AC
  Administered 2020-04-16: 400 mg via ORAL

## 2020-04-16 MED ORDER — ACETAMINOPHEN 325 MG PO TABS
650.0000 mg | ORAL_TABLET | Freq: Once | ORAL | Status: AC
  Administered 2020-04-16: 650 mg via ORAL

## 2020-04-16 NOTE — ED Notes (Signed)
Pt c/o severe dental pain in lobby. This RN to give tylenol and ibuprofen

## 2020-04-16 NOTE — ED Triage Notes (Signed)
Pt reports tingling to L side of body (everything except head) since Friday and feeling lightheaded.  No arm drift.  Denies weakness.

## 2020-04-17 NOTE — ED Notes (Signed)
Called for repeat VS x3, no response. 

## 2020-04-30 ENCOUNTER — Other Ambulatory Visit: Payer: Self-pay

## 2020-04-30 ENCOUNTER — Emergency Department (HOSPITAL_COMMUNITY)
Admission: EM | Admit: 2020-04-30 | Discharge: 2020-04-30 | Disposition: A | Discharge status: Home / Self Care | Payer: Self-pay | Attending: Emergency Medicine | Admitting: Emergency Medicine

## 2020-04-30 ENCOUNTER — Encounter (HOSPITAL_COMMUNITY): Payer: Self-pay | Admitting: Emergency Medicine

## 2020-04-30 DIAGNOSIS — Z5321 Procedure and treatment not carried out due to patient leaving prior to being seen by health care provider: Secondary | ICD-10-CM | POA: Insufficient documentation

## 2020-04-30 DIAGNOSIS — R202 Paresthesia of skin: Secondary | ICD-10-CM | POA: Insufficient documentation

## 2020-04-30 NOTE — ED Notes (Signed)
Name called 3 times with no answer 

## 2020-04-30 NOTE — ED Triage Notes (Signed)
Pt reports continued tingling to L side of body since he was seen in ED on 2/6 and LWBS.  No neuro deficits noted.

## 2023-10-17 ENCOUNTER — Encounter (HOSPITAL_COMMUNITY): Payer: Self-pay | Admitting: *Deleted

## 2023-10-17 ENCOUNTER — Other Ambulatory Visit: Payer: Self-pay

## 2023-10-17 ENCOUNTER — Emergency Department (HOSPITAL_COMMUNITY)
Admission: EM | Admit: 2023-10-17 | Discharge: 2023-10-18 | Discharge status: Against Medical Advice | Payer: Self-pay | Attending: Emergency Medicine | Admitting: Emergency Medicine

## 2023-10-17 DIAGNOSIS — Z5321 Procedure and treatment not carried out due to patient leaving prior to being seen by health care provider: Secondary | ICD-10-CM | POA: Insufficient documentation

## 2023-10-17 DIAGNOSIS — S8992XA Unspecified injury of left lower leg, initial encounter: Secondary | ICD-10-CM | POA: Insufficient documentation

## 2023-10-17 DIAGNOSIS — W269XXA Contact with unspecified sharp object(s), initial encounter: Secondary | ICD-10-CM | POA: Insufficient documentation

## 2023-10-17 NOTE — ED Triage Notes (Signed)
 Pt was running and cut his left leg on something. Not sure what he cut it on. States it will not stop bleeding. States he came off blood thinners about 5 weeks ago. Thinks his tetnus is up to date.

## 2023-10-17 NOTE — ED Notes (Signed)
 Pt left this ED and to the bus stop.
